# Patient Record
Sex: Male | Born: 1935 | Race: White | Hispanic: No | Marital: Married | Smoking: Never smoker
Health system: Southern US, Community
[De-identification: ages and names within clinical notes are randomized; demographics above are authoritative.]

## PROBLEM LIST (undated history)

## (undated) DIAGNOSIS — G14 Postpolio syndrome: Secondary | ICD-10-CM

## (undated) DIAGNOSIS — H409 Unspecified glaucoma: Secondary | ICD-10-CM

## (undated) DIAGNOSIS — Z8546 Personal history of malignant neoplasm of prostate: Secondary | ICD-10-CM

## (undated) DIAGNOSIS — E119 Type 2 diabetes mellitus without complications: Secondary | ICD-10-CM

## (undated) DIAGNOSIS — G629 Polyneuropathy, unspecified: Secondary | ICD-10-CM

## (undated) DIAGNOSIS — K219 Gastro-esophageal reflux disease without esophagitis: Secondary | ICD-10-CM

## (undated) DIAGNOSIS — M1990 Unspecified osteoarthritis, unspecified site: Secondary | ICD-10-CM

## (undated) HISTORY — DX: Personal history of malignant neoplasm of prostate: Z85.46

## (undated) HISTORY — DX: Unspecified osteoarthritis, unspecified site: M19.90

## (undated) HISTORY — PX: EYE SURGERY: SHX253

## (undated) HISTORY — DX: Unspecified glaucoma: H40.9

---

## 2002-07-26 ENCOUNTER — Ambulatory Visit (HOSPITAL_COMMUNITY): Admission: RE | Admit: 2002-07-26 | Discharge: 2002-07-26 | Payer: Self-pay | Admitting: *Deleted

## 2005-03-04 ENCOUNTER — Ambulatory Visit: Payer: Self-pay | Admitting: Psychology

## 2005-04-03 ENCOUNTER — Ambulatory Visit: Payer: Self-pay | Admitting: Psychology

## 2005-05-06 ENCOUNTER — Ambulatory Visit: Payer: Self-pay | Admitting: Psychology

## 2005-05-20 ENCOUNTER — Ambulatory Visit: Payer: Self-pay | Admitting: Psychology

## 2005-06-14 ENCOUNTER — Ambulatory Visit: Payer: Self-pay | Admitting: Psychology

## 2005-07-11 ENCOUNTER — Emergency Department (HOSPITAL_COMMUNITY): Admission: EM | Admit: 2005-07-11 | Discharge: 2005-07-11 | Payer: Self-pay | Admitting: Emergency Medicine

## 2005-07-17 ENCOUNTER — Ambulatory Visit: Payer: Self-pay | Admitting: Psychology

## 2005-07-23 ENCOUNTER — Ambulatory Visit: Payer: Self-pay | Admitting: Psychology

## 2005-07-29 ENCOUNTER — Ambulatory Visit: Payer: Self-pay | Admitting: Psychology

## 2005-08-21 ENCOUNTER — Ambulatory Visit: Payer: Self-pay | Admitting: Psychology

## 2005-09-06 ENCOUNTER — Ambulatory Visit: Payer: Self-pay | Admitting: Psychology

## 2005-09-30 HISTORY — PX: INSERTION PROSTATE RADIATION SEED: SUR718

## 2005-10-15 ENCOUNTER — Encounter: Admission: RE | Admit: 2005-10-15 | Discharge: 2005-10-15 | Payer: Self-pay | Admitting: Neurology

## 2005-10-22 ENCOUNTER — Ambulatory Visit: Payer: Self-pay | Admitting: Psychology

## 2005-11-04 ENCOUNTER — Ambulatory Visit: Payer: Self-pay | Admitting: Pulmonary Disease

## 2005-11-05 ENCOUNTER — Ambulatory Visit: Payer: Self-pay | Admitting: Psychology

## 2005-11-12 ENCOUNTER — Ambulatory Visit: Payer: Self-pay | Admitting: Psychology

## 2005-12-03 ENCOUNTER — Ambulatory Visit: Payer: Self-pay | Admitting: Psychology

## 2005-12-31 ENCOUNTER — Ambulatory Visit: Payer: Self-pay | Admitting: Psychology

## 2006-01-14 ENCOUNTER — Ambulatory Visit: Payer: Self-pay | Admitting: Psychology

## 2006-02-11 ENCOUNTER — Ambulatory Visit: Payer: Self-pay | Admitting: Psychology

## 2006-02-25 ENCOUNTER — Ambulatory Visit: Payer: Self-pay | Admitting: Psychology

## 2006-05-06 ENCOUNTER — Ambulatory Visit: Payer: Self-pay | Admitting: Psychology

## 2006-05-20 ENCOUNTER — Ambulatory Visit: Payer: Self-pay | Admitting: Psychology

## 2006-06-17 ENCOUNTER — Ambulatory Visit: Payer: Self-pay | Admitting: Psychology

## 2006-07-14 ENCOUNTER — Ambulatory Visit: Payer: Self-pay | Admitting: Psychology

## 2006-07-29 ENCOUNTER — Ambulatory Visit: Payer: Self-pay | Admitting: Psychology

## 2006-08-26 ENCOUNTER — Ambulatory Visit: Payer: Self-pay | Admitting: Psychology

## 2010-09-30 DIAGNOSIS — F0390 Unspecified dementia without behavioral disturbance: Secondary | ICD-10-CM

## 2010-09-30 HISTORY — DX: Unspecified dementia, unspecified severity, without behavioral disturbance, psychotic disturbance, mood disturbance, and anxiety: F03.90

## 2014-09-30 HISTORY — PX: MELANOMA EXCISION: SHX5266

## 2021-01-28 ENCOUNTER — Emergency Department (HOSPITAL_COMMUNITY): Payer: Medicare PPO

## 2021-01-28 ENCOUNTER — Emergency Department (HOSPITAL_COMMUNITY)
Admission: EM | Admit: 2021-01-28 | Discharge: 2021-01-28 | Disposition: A | Discharge status: Home / Self Care | Payer: Medicare PPO | Attending: Emergency Medicine | Admitting: Emergency Medicine

## 2021-01-28 ENCOUNTER — Encounter (HOSPITAL_COMMUNITY): Payer: Self-pay | Admitting: Pharmacy Technician

## 2021-01-28 ENCOUNTER — Other Ambulatory Visit: Payer: Self-pay

## 2021-01-28 DIAGNOSIS — E119 Type 2 diabetes mellitus without complications: Secondary | ICD-10-CM | POA: Diagnosis not present

## 2021-01-28 DIAGNOSIS — G44309 Post-traumatic headache, unspecified, not intractable: Secondary | ICD-10-CM | POA: Insufficient documentation

## 2021-01-28 DIAGNOSIS — F0390 Unspecified dementia without behavioral disturbance: Secondary | ICD-10-CM | POA: Diagnosis not present

## 2021-01-28 DIAGNOSIS — M545 Low back pain, unspecified: Secondary | ICD-10-CM | POA: Insufficient documentation

## 2021-01-28 DIAGNOSIS — R519 Headache, unspecified: Secondary | ICD-10-CM | POA: Diagnosis present

## 2021-01-28 DIAGNOSIS — Y92002 Bathroom of unspecified non-institutional (private) residence single-family (private) house as the place of occurrence of the external cause: Secondary | ICD-10-CM | POA: Diagnosis not present

## 2021-01-28 DIAGNOSIS — W182XXA Fall in (into) shower or empty bathtub, initial encounter: Secondary | ICD-10-CM | POA: Diagnosis not present

## 2021-01-28 DIAGNOSIS — W19XXXA Unspecified fall, initial encounter: Secondary | ICD-10-CM

## 2021-01-28 HISTORY — DX: Type 2 diabetes mellitus without complications: E11.9

## 2021-01-28 LAB — CBC WITH DIFFERENTIAL/PLATELET
Abs Immature Granulocytes: 0.02 10*3/uL (ref 0.00–0.07)
Basophils Absolute: 0 10*3/uL (ref 0.0–0.1)
Basophils Relative: 0 %
Eosinophils Absolute: 0.4 10*3/uL (ref 0.0–0.5)
Eosinophils Relative: 6 %
HCT: 38.3 % — ABNORMAL LOW (ref 39.0–52.0)
Hemoglobin: 12.4 g/dL — ABNORMAL LOW (ref 13.0–17.0)
Immature Granulocytes: 0 %
Lymphocytes Relative: 29 %
Lymphs Abs: 2 10*3/uL (ref 0.7–4.0)
MCH: 30.4 pg (ref 26.0–34.0)
MCHC: 32.4 g/dL (ref 30.0–36.0)
MCV: 93.9 fL (ref 80.0–100.0)
Monocytes Absolute: 0.6 10*3/uL (ref 0.1–1.0)
Monocytes Relative: 8 %
Neutro Abs: 3.9 10*3/uL (ref 1.7–7.7)
Neutrophils Relative %: 57 %
Platelets: 256 10*3/uL (ref 150–400)
RBC: 4.08 MIL/uL — ABNORMAL LOW (ref 4.22–5.81)
RDW: 13.8 % (ref 11.5–15.5)
WBC: 7 10*3/uL (ref 4.0–10.5)
nRBC: 0 % (ref 0.0–0.2)

## 2021-01-28 LAB — COMPREHENSIVE METABOLIC PANEL
ALT: 28 U/L (ref 0–44)
AST: 22 U/L (ref 15–41)
Albumin: 3.7 g/dL (ref 3.5–5.0)
Alkaline Phosphatase: 34 U/L — ABNORMAL LOW (ref 38–126)
Anion gap: 7 (ref 5–15)
BUN: 16 mg/dL (ref 8–23)
CO2: 25 mmol/L (ref 22–32)
Calcium: 8.9 mg/dL (ref 8.9–10.3)
Chloride: 105 mmol/L (ref 98–111)
Creatinine, Ser: 1.3 mg/dL — ABNORMAL HIGH (ref 0.61–1.24)
GFR, Estimated: 55 mL/min — ABNORMAL LOW (ref >60)
Glucose, Bld: 180 mg/dL — ABNORMAL HIGH (ref 70–99)
Potassium: 4.7 mmol/L (ref 3.5–5.1)
Sodium: 137 mmol/L (ref 135–145)
Total Bilirubin: 0.4 mg/dL (ref 0.3–1.2)
Total Protein: 6 g/dL — ABNORMAL LOW (ref 6.5–8.1)

## 2021-01-28 LAB — URINALYSIS, ROUTINE W REFLEX MICROSCOPIC
Bilirubin Urine: NEGATIVE
Glucose, UA: 50 mg/dL — AB
Hgb urine dipstick: NEGATIVE
Ketones, ur: NEGATIVE mg/dL
Leukocytes,Ua: NEGATIVE
Nitrite: NEGATIVE
Protein, ur: NEGATIVE mg/dL
Specific Gravity, Urine: 1.015 (ref 1.005–1.030)
pH: 5 (ref 5.0–8.0)

## 2021-01-28 MED ORDER — ACETAMINOPHEN 500 MG PO TABS
1000.0000 mg | ORAL_TABLET | Freq: Once | ORAL | Status: AC
  Administered 2021-01-28: 1000 mg via ORAL
  Filled 2021-01-28: qty 2

## 2021-01-28 NOTE — ED Notes (Signed)
Patient transported to CT 

## 2021-01-28 NOTE — ED Triage Notes (Signed)
Pt here via pov with reports of multiple falls since last night. Pt states he feels "strange" and then falls. Pt denies injury from falls.

## 2021-01-28 NOTE — ED Notes (Signed)
Pt ambulated from room to bathroom using walker with 1RN and pt's wife assist. Pt denied feeling dizzy or light headed.

## 2021-01-28 NOTE — Discharge Instructions (Signed)
Your imaging today was overall reassuring.  No signs of head bleed, intracranial trauma, fracture, dislocation. Your blood work was reassuring, did not indicate any acute abnormality. Follow-up with your primary care doctor for recheck. Return to emergency room with any new, worsening, concerning symptoms.

## 2021-01-28 NOTE — ED Provider Notes (Signed)
[facility] EMERGENCY DEPARTMENT Provider Note   CSN: [id number] Arrival date & time: 01/28/21  0848     History Chief Complaint  Patient presents with  . Fall    Jeffery Stewart is a 85 y.o. male presenting for evaluation after fall.  Level 5 caveat due to dementia.  History provided mostly by patient's wife.  States he fell yesterday, hitting the back of his head against the bathtub.  He did not lose consciousness, but reported seeing "stars."  Patient had no change in alteration and no headache immediately after the fall.  He does have a mild headache today, prompting his ER visit.  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  He is not on blood thinners.  He has been mentating at his baseline today, and ambulatory to his baseline.  He did have another fall this morning, which she describes as him sliding off the chair, which is not abnormal for him.  She denies recent fever, cough, reported chest pain, nausea, vomiting, report of abdominal pain, change in urination or bowel output.  No recent change in medications.  HPI     Past Medical History:  Diagnosis Date  . Diabetes mellitus without complication (HCC)     There are no problems to display for this patient.   History reviewed. No pertinent surgical history.     No family history on file.     Home Medications Prior to Admission medications   Not on File    Allergies    Prednisone  Review of Systems   Review of Systems  Unable to perform ROS: Dementia  Neurological: Positive for headaches.    Physical Exam Updated Vital Signs BP (!) 163/74   Pulse (!) 47   Temp 97.6 F (36.4 C) (Oral)   Resp 17   SpO2 100%   Physical Exam Vitals and nursing note reviewed.  Constitutional:      General: He is not in acute distress.    Appearance: He is well-developed.     Comments: Resting in the bed in NAD  HENT:     Head: Normocephalic and  atraumatic.     Comments: No obvious signs of head trauma Eyes:     Extraocular Movements: Extraocular movements intact.     Conjunctiva/sclera: Conjunctivae normal.     Pupils: Pupils are equal, round, and reactive to light.  Neck:     Comments: Mild diffuse ttp of the neck, baseline per wife Cardiovascular:     Rate and Rhythm: Normal rate and regular rhythm.     Pulses: Normal pulses.  Pulmonary:     Effort: Pulmonary effort is normal. No respiratory distress.     Breath sounds: Normal breath sounds. No wheezing.  Abdominal:     General: There is no distension.     Palpations: Abdomen is soft. There is no mass.     Tenderness: There is no abdominal tenderness. There is no guarding or rebound.  Musculoskeletal:        General: Normal range of motion.     Cervical back: Normal range of motion and neck supple.     Comments: Strength intact x4.  No tenderness palpation of back or midline spine.  No step-offs or deformities.  Able to go from laying to sitting without difficulty  Skin:    General: Skin is warm and dry.     Capillary Refill: Capillary refill takes less than 2  seconds.  Neurological:     Mental Status: He is alert.     Comments: Oriented to person, baseline per wife     ED Results / Procedures / Treatments   Labs (all labs ordered are listed, but only abnormal results are displayed) Labs Reviewed  CBC WITH DIFFERENTIAL/PLATELET - Abnormal; Notable for the following components:      Result Value   RBC 4.08 (*)    Hemoglobin 12.4 (*)    HCT 38.3 (*)    All other components within normal limits  COMPREHENSIVE METABOLIC PANEL - Abnormal; Notable for the following components:   Glucose, Bld 180 (*)    Creatinine, Ser 1.30 (*)    Total Protein 6.0 (*)    Alkaline Phosphatase 34 (*)    GFR, Estimated 55 (*)    All other components within normal limits  URINALYSIS, ROUTINE W REFLEX MICROSCOPIC - Abnormal; Notable for the following components:   Glucose, UA 50 (*)     All other components within normal limits    EKG EKG Interpretation  Date/Time:  Sunday Jan 28 2021 08:59:15 EDT Ventricular Rate:  47 PR Interval:  121 QRS Duration: 93 QT Interval:  451 QTC Calculation: 399 R Axis:   -19 Text Interpretation: Sinus or ectopic atrial bradycardia Borderline left axis deviation Low voltage, precordial leads Abnormal R-wave progression, early transition Confirmed by Kristine Royal [id number]) on 01/28/2021 9:04:18 AM   Radiology DG Lumbar Spine Complete  Result Date: 01/28/2021 CLINICAL DATA:  Pain.  Fall. EXAM: LUMBAR SPINE - COMPLETE 4+ VIEW COMPARISON:  None. FINDINGS: Brachytherapy seeds in the region of the prostate. Scoliotic curvature lumbar spine, apex to the left. No other malalignment. No fractures. Minimal degenerative disc disease with tiny anterior osteophytes. Lower lumbar facet degenerative changes. Mild calcified atherosclerosis in the abdominal aorta. IMPRESSION: 1. No fracture or traumatic malalignment. 2. Degenerative changes, particularly in the lower lumbar facets. 3. Mild calcified atherosclerosis in the abdominal aorta. Electronically Signed   By: Gerome Sam III M.D   On: 01/28/2021 12:34   DG Pelvis 1-2 Views  Result Date: 01/28/2021 CLINICAL DATA:  Pain after fall EXAM: PELVIS - 1-2 VIEW COMPARISON:  None. FINDINGS: There is no evidence of pelvic fracture or diastasis. No pelvic bone lesions are seen. IMPRESSION: Negative. Electronically Signed   By: Gerome Sam III M.D   On: 01/28/2021 12:35   CT Head Wo Contrast  Result Date: 01/28/2021 CLINICAL DATA:  Multiple falls.  Pain. EXAM: CT HEAD WITHOUT CONTRAST CT CERVICAL SPINE WITHOUT CONTRAST TECHNIQUE: Multidetector CT imaging of the head and cervical spine was performed following the standard protocol without intravenous contrast. Multiplanar CT image reconstructions of the cervical spine were also generated. COMPARISON:  None. FINDINGS: CT HEAD FINDINGS Brain: No evidence of  acute infarction, hemorrhage, hydrocephalus, extra-axial collection or mass lesion/mass effect. Vascular: Calcified atherosclerosis is seen in the intracranial carotids. Skull: Normal. Negative for fracture or focal lesion. Sinuses/Orbits: No acute finding. Other: None. CT CERVICAL SPINE FINDINGS Alignment: Minimal anterolisthesis of C5 versus C6 was also seen on an x-ray from 2006, nonacute. There is mild focal reversal of normal lordosis in this region. No other malalignment. Skull base and vertebrae: No acute fracture. No primary bone lesion or focal pathologic process. Soft tissues and spinal canal: No prevertebral fluid or swelling. No visible canal hematoma. Disc levels: Degenerative disc disease most marked at C5-6, C6-7, and C7-T1. Facet degenerative changes. Upper chest: Negative. Other: No other abnormalities. IMPRESSION: 1. No acute intracranial  abnormalities. 2. No fracture or traumatic malalignment in the cervical spine. Degenerative changes. Electronically Signed   By: Gerome Sam III M.D   On: 01/28/2021 12:33   CT Cervical Spine Wo Contrast  Result Date: 01/28/2021 CLINICAL DATA:  Multiple falls.  Pain. EXAM: CT HEAD WITHOUT CONTRAST CT CERVICAL SPINE WITHOUT CONTRAST TECHNIQUE: Multidetector CT imaging of the head and cervical spine was performed following the standard protocol without intravenous contrast. Multiplanar CT image reconstructions of the cervical spine were also generated. COMPARISON:  None. FINDINGS: CT HEAD FINDINGS Brain: No evidence of acute infarction, hemorrhage, hydrocephalus, extra-axial collection or mass lesion/mass effect. Vascular: Calcified atherosclerosis is seen in the intracranial carotids. Skull: Normal. Negative for fracture or focal lesion. Sinuses/Orbits: No acute finding. Other: None. CT CERVICAL SPINE FINDINGS Alignment: Minimal anterolisthesis of C5 versus C6 was also seen on an x-ray from 2006, nonacute. There is mild focal reversal of normal lordosis in  this region. No other malalignment. Skull base and vertebrae: No acute fracture. No primary bone lesion or focal pathologic process. Soft tissues and spinal canal: No prevertebral fluid or swelling. No visible canal hematoma. Disc levels: Degenerative disc disease most marked at C5-6, C6-7, and C7-T1. Facet degenerative changes. Upper chest: Negative. Other: No other abnormalities. IMPRESSION: 1. No acute intracranial abnormalities. 2. No fracture or traumatic malalignment in the cervical spine. Degenerative changes. Electronically Signed   By: Gerome Sam III M.D   On: 01/28/2021 12:33    Procedures Procedures   Medications Ordered in ED Medications  acetaminophen (TYLENOL) tablet 1,000 mg (1,000 mg Oral Given 01/28/21 1046)    ED Course  I have reviewed the triage vital signs and the nursing notes.  Pertinent labs & imaging results that were available during my care of the patient were reviewed by me and considered in my medical decision making (see chart for details).    MDM Rules/Calculators/A&P                          Patient presented for evaluation of fall yesterday and again this morning.  On exam, patient appears nontoxic.  No obvious signs of head trauma.  Per wife, he is at baseline mental status.  He has no obvious neurologic deficits outside of his normal on my exam.  As patient cannot state what caused him to fall, will obtain basic labs, urine, EKG to ensure no obvious infection, metabolic abnormality, anemia, arrhythmia.  Will obtain CT imaging of head and neck due to trauma.  On reassessment, patient now is reporting pain in his low back.  He had no pain initially on my exam, however due to his dementia, his exam may be unreliable.  We will add on x-rays of the low back and pelvis.  CT head and neck negative for acute findings.  X-ray of pelvis and low back viewed and independently interpreted by me, no fracture or dislocation.  Labs overall reassuring.  No significant  anemia.  Minimal nonspecific leukocytosis.  Without fever or infectious symptoms, doubt infection.  Electrolytes are stable.  Urine negative for infection.  Discussed findings with patient and wife.  Patient able to ambulate.  At this time, patient appears safe for discharge.  Return precautions given.  Patient states he understands and agrees to plan.  Final Clinical Impression(s) / ED Diagnoses Final diagnoses:  Fall, initial encounter  Acute low back pain without sciatica, unspecified back pain laterality  Post-traumatic headache, not intractable, unspecified chronicity pattern  Rx / DC Orders ED Discharge Orders    None       Alveria ApleyCaccavale, Kahli Fitzgerald, PA-C 01/28/21 1518    Wynetta FinesMessick, Peter C, MD 01/29/21 [phone]

## 2021-12-12 DIAGNOSIS — Z808 Family history of malignant neoplasm of other organs or systems: Secondary | ICD-10-CM | POA: Diagnosis not present

## 2021-12-12 DIAGNOSIS — L570 Actinic keratosis: Secondary | ICD-10-CM | POA: Diagnosis not present

## 2021-12-12 DIAGNOSIS — D225 Melanocytic nevi of trunk: Secondary | ICD-10-CM | POA: Diagnosis not present

## 2021-12-12 DIAGNOSIS — D485 Neoplasm of uncertain behavior of skin: Secondary | ICD-10-CM | POA: Diagnosis not present

## 2022-01-02 DIAGNOSIS — E7800 Pure hypercholesterolemia, unspecified: Secondary | ICD-10-CM | POA: Diagnosis not present

## 2022-01-02 DIAGNOSIS — N1831 Chronic kidney disease, stage 3a: Secondary | ICD-10-CM | POA: Diagnosis not present

## 2022-01-02 DIAGNOSIS — E1142 Type 2 diabetes mellitus with diabetic polyneuropathy: Secondary | ICD-10-CM | POA: Diagnosis not present

## 2022-01-02 DIAGNOSIS — E1122 Type 2 diabetes mellitus with diabetic chronic kidney disease: Secondary | ICD-10-CM | POA: Diagnosis not present

## 2022-01-09 DIAGNOSIS — Z0000 Encounter for general adult medical examination without abnormal findings: Secondary | ICD-10-CM | POA: Diagnosis not present

## 2022-01-09 DIAGNOSIS — F3340 Major depressive disorder, recurrent, in remission, unspecified: Secondary | ICD-10-CM | POA: Diagnosis not present

## 2022-01-09 DIAGNOSIS — E1122 Type 2 diabetes mellitus with diabetic chronic kidney disease: Secondary | ICD-10-CM | POA: Diagnosis not present

## 2022-01-09 DIAGNOSIS — D649 Anemia, unspecified: Secondary | ICD-10-CM | POA: Diagnosis not present

## 2022-01-09 DIAGNOSIS — G2581 Restless legs syndrome: Secondary | ICD-10-CM | POA: Diagnosis not present

## 2022-01-09 DIAGNOSIS — G4761 Periodic limb movement disorder: Secondary | ICD-10-CM | POA: Diagnosis not present

## 2022-01-09 DIAGNOSIS — Z8546 Personal history of malignant neoplasm of prostate: Secondary | ICD-10-CM | POA: Diagnosis not present

## 2022-01-09 DIAGNOSIS — E1142 Type 2 diabetes mellitus with diabetic polyneuropathy: Secondary | ICD-10-CM | POA: Diagnosis not present

## 2022-01-09 DIAGNOSIS — G309 Alzheimer's disease, unspecified: Secondary | ICD-10-CM | POA: Diagnosis not present

## 2022-02-07 DIAGNOSIS — G4700 Insomnia, unspecified: Secondary | ICD-10-CM | POA: Diagnosis not present

## 2022-02-07 DIAGNOSIS — I10 Essential (primary) hypertension: Secondary | ICD-10-CM | POA: Diagnosis not present

## 2022-02-07 DIAGNOSIS — K219 Gastro-esophageal reflux disease without esophagitis: Secondary | ICD-10-CM | POA: Diagnosis not present

## 2022-02-07 DIAGNOSIS — E785 Hyperlipidemia, unspecified: Secondary | ICD-10-CM | POA: Diagnosis not present

## 2022-02-07 DIAGNOSIS — F02A0 Dementia in other diseases classified elsewhere, mild, without behavioral disturbance, psychotic disturbance, mood disturbance, and anxiety: Secondary | ICD-10-CM | POA: Diagnosis not present

## 2022-02-07 DIAGNOSIS — E1142 Type 2 diabetes mellitus with diabetic polyneuropathy: Secondary | ICD-10-CM | POA: Diagnosis not present

## 2022-02-07 DIAGNOSIS — F3342 Major depressive disorder, recurrent, in full remission: Secondary | ICD-10-CM | POA: Diagnosis not present

## 2022-02-07 DIAGNOSIS — G309 Alzheimer's disease, unspecified: Secondary | ICD-10-CM | POA: Diagnosis not present

## 2022-02-07 DIAGNOSIS — H409 Unspecified glaucoma: Secondary | ICD-10-CM | POA: Diagnosis not present

## 2022-03-06 DIAGNOSIS — B91 Sequelae of poliomyelitis: Secondary | ICD-10-CM | POA: Diagnosis not present

## 2022-03-06 DIAGNOSIS — G301 Alzheimer's disease with late onset: Secondary | ICD-10-CM | POA: Diagnosis not present

## 2022-03-06 DIAGNOSIS — M549 Dorsalgia, unspecified: Secondary | ICD-10-CM | POA: Diagnosis not present

## 2022-03-06 DIAGNOSIS — F02C18 Dementia in other diseases classified elsewhere, severe, with other behavioral disturbance: Secondary | ICD-10-CM | POA: Diagnosis not present

## 2022-03-06 DIAGNOSIS — G8929 Other chronic pain: Secondary | ICD-10-CM | POA: Diagnosis not present

## 2022-03-06 DIAGNOSIS — F0280 Dementia in other diseases classified elsewhere without behavioral disturbance: Secondary | ICD-10-CM | POA: Diagnosis not present

## 2022-03-06 DIAGNOSIS — G309 Alzheimer's disease, unspecified: Secondary | ICD-10-CM | POA: Diagnosis not present

## 2022-03-06 DIAGNOSIS — M6281 Muscle weakness (generalized): Secondary | ICD-10-CM | POA: Diagnosis not present

## 2022-04-15 DIAGNOSIS — R051 Acute cough: Secondary | ICD-10-CM | POA: Diagnosis not present

## 2022-05-08 DIAGNOSIS — K219 Gastro-esophageal reflux disease without esophagitis: Secondary | ICD-10-CM | POA: Diagnosis not present

## 2022-05-08 DIAGNOSIS — E1142 Type 2 diabetes mellitus with diabetic polyneuropathy: Secondary | ICD-10-CM | POA: Diagnosis not present

## 2022-05-08 DIAGNOSIS — D649 Anemia, unspecified: Secondary | ICD-10-CM | POA: Diagnosis not present

## 2022-05-08 DIAGNOSIS — E1122 Type 2 diabetes mellitus with diabetic chronic kidney disease: Secondary | ICD-10-CM | POA: Diagnosis not present

## 2022-05-16 DIAGNOSIS — H401131 Primary open-angle glaucoma, bilateral, mild stage: Secondary | ICD-10-CM | POA: Diagnosis not present

## 2022-05-16 DIAGNOSIS — Z961 Presence of intraocular lens: Secondary | ICD-10-CM | POA: Diagnosis not present

## 2022-05-16 DIAGNOSIS — E119 Type 2 diabetes mellitus without complications: Secondary | ICD-10-CM | POA: Diagnosis not present

## 2022-05-16 DIAGNOSIS — H04123 Dry eye syndrome of bilateral lacrimal glands: Secondary | ICD-10-CM | POA: Diagnosis not present

## 2022-05-20 DIAGNOSIS — K120 Recurrent oral aphthae: Secondary | ICD-10-CM | POA: Diagnosis not present

## 2022-05-27 DIAGNOSIS — N1831 Chronic kidney disease, stage 3a: Secondary | ICD-10-CM | POA: Diagnosis not present

## 2022-05-27 DIAGNOSIS — E1142 Type 2 diabetes mellitus with diabetic polyneuropathy: Secondary | ICD-10-CM | POA: Diagnosis not present

## 2022-05-27 DIAGNOSIS — G14 Postpolio syndrome: Secondary | ICD-10-CM | POA: Diagnosis not present

## 2022-05-27 DIAGNOSIS — G2581 Restless legs syndrome: Secondary | ICD-10-CM | POA: Diagnosis not present

## 2022-05-27 DIAGNOSIS — K219 Gastro-esophageal reflux disease without esophagitis: Secondary | ICD-10-CM | POA: Diagnosis not present

## 2022-05-27 DIAGNOSIS — E1122 Type 2 diabetes mellitus with diabetic chronic kidney disease: Secondary | ICD-10-CM | POA: Diagnosis not present

## 2022-05-27 DIAGNOSIS — G309 Alzheimer's disease, unspecified: Secondary | ICD-10-CM | POA: Diagnosis not present

## 2022-05-27 DIAGNOSIS — G4761 Periodic limb movement disorder: Secondary | ICD-10-CM | POA: Diagnosis not present

## 2022-05-27 DIAGNOSIS — D649 Anemia, unspecified: Secondary | ICD-10-CM | POA: Diagnosis not present

## 2022-06-23 IMAGING — CT CT CERVICAL SPINE W/O CM
3 of 4 series · 12 of 33 positions shown, 14 images · non-contrast
Comparison: None.

CLINICAL DATA: Multiple falls.  Pain.

EXAM:
CT HEAD WITHOUT CONTRAST
CT CERVICAL SPINE WITHOUT CONTRAST
TECHNIQUE: Multidetector CT imaging of the head and cervical spine was
performed following the standard protocol without intravenous
contrast. Multiplanar CT image reconstructions of the cervical spine
were also generated.

[Series 5: c_spine 2.0 st · axial · 0.39mm/px · z∈[+1028,+1160]mm · 4 of 100 slices shown, 5 images]
[im 17/100  soft-tissue]
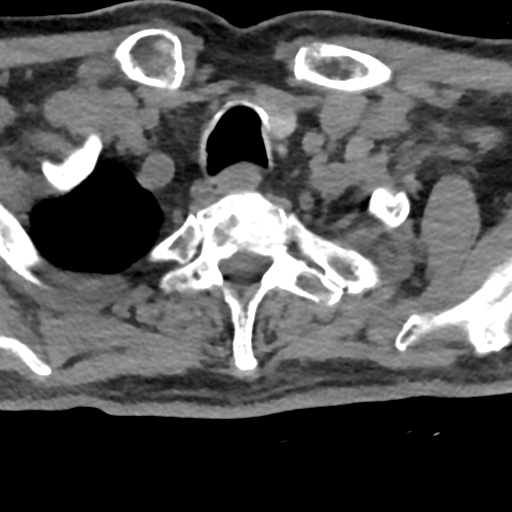
[im 17/100  bone]
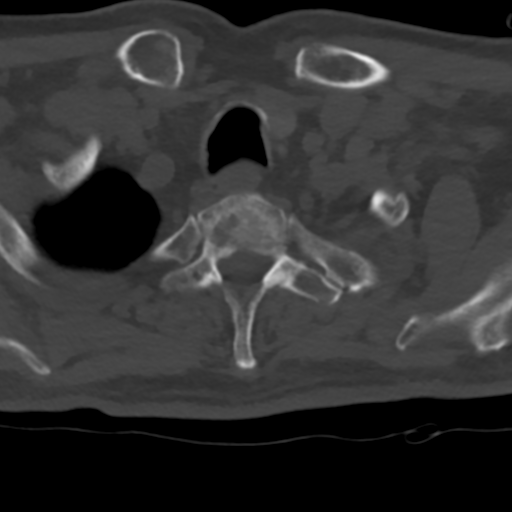
[im 34/100  bone]
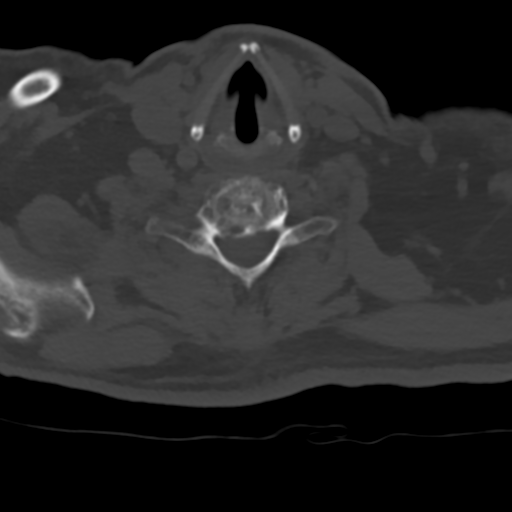
[im 67/100  bone]
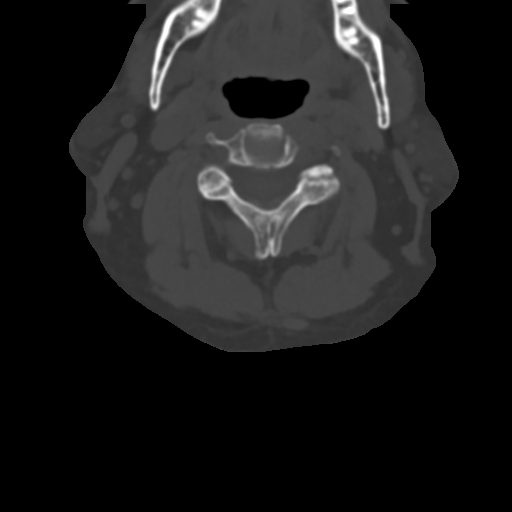
[im 83/100  bone]
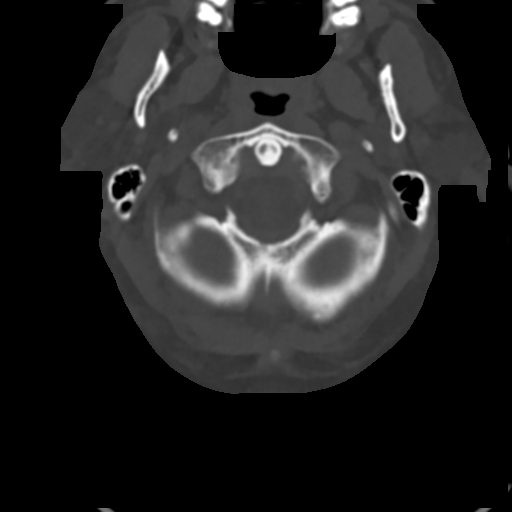

[Series 6: coronal bone · coronal · 0.31mm/px · 3 of 61 slices shown]
[im 13/61  bone]
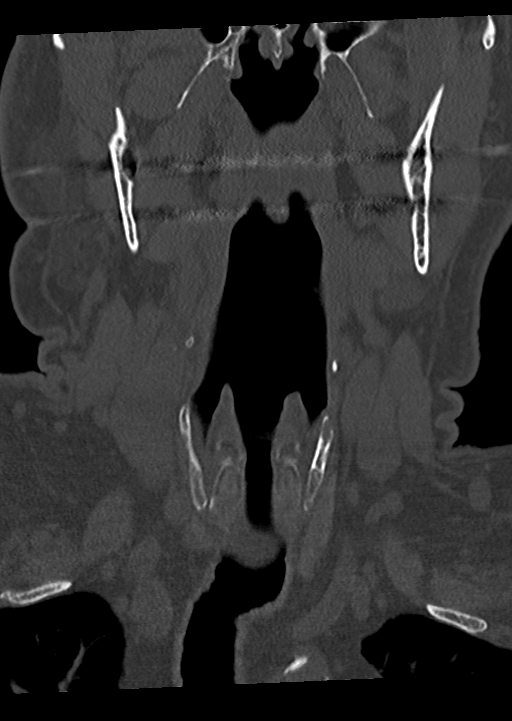
[im 25/61  bone]
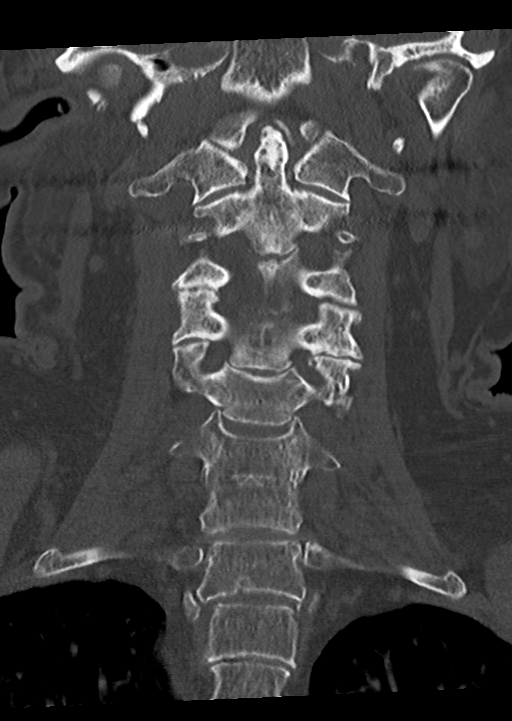
[im 37/61  bone]
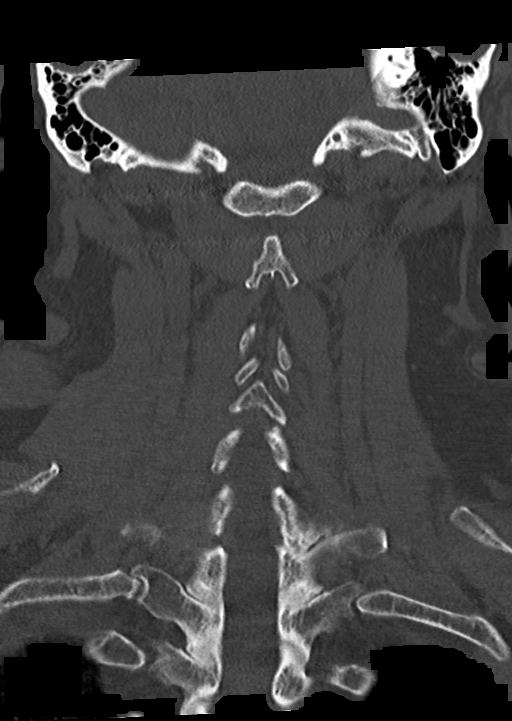

[Series 7: sagittal bone · sagittal · 0.30mm/px · 5 of 68 slices shown, 6 images]
[im 23/68  bone]
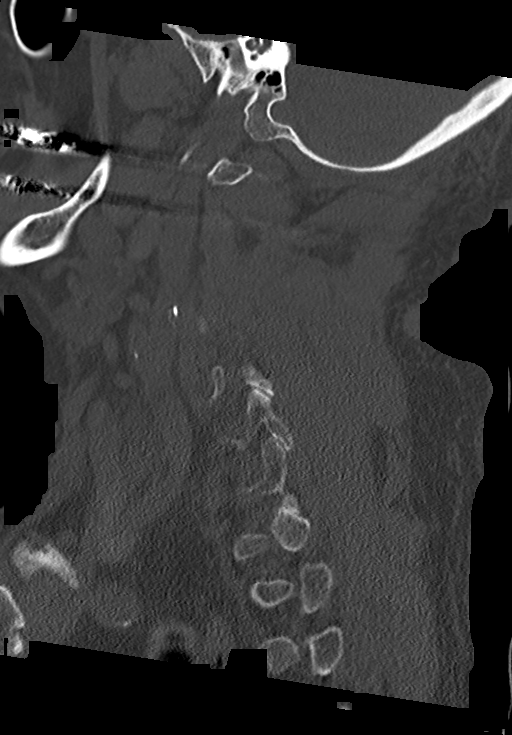
[im 28/68  bone]
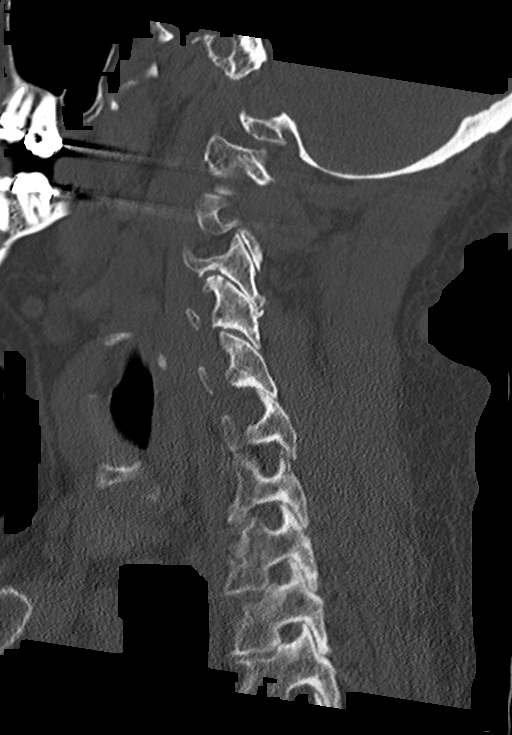
[im 34/68  soft-tissue]
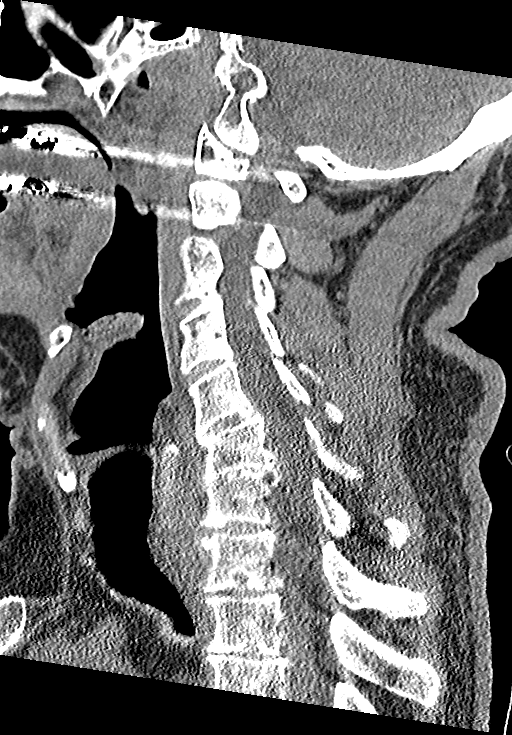
[im 34/68  bone]
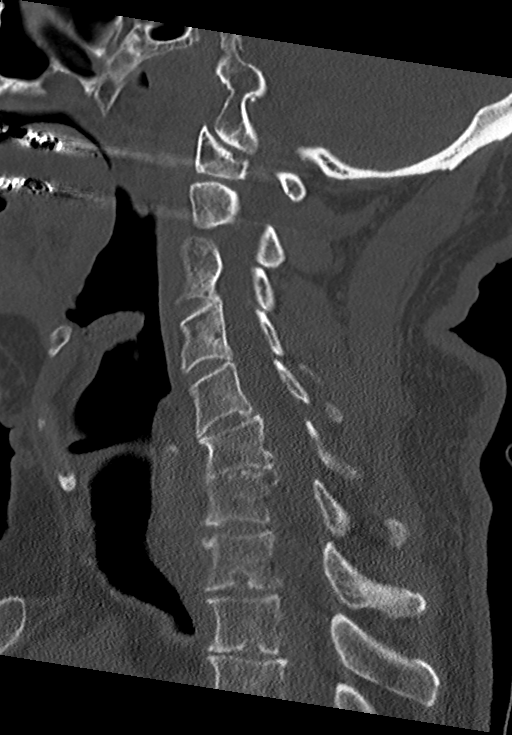
[im 40/68  bone]
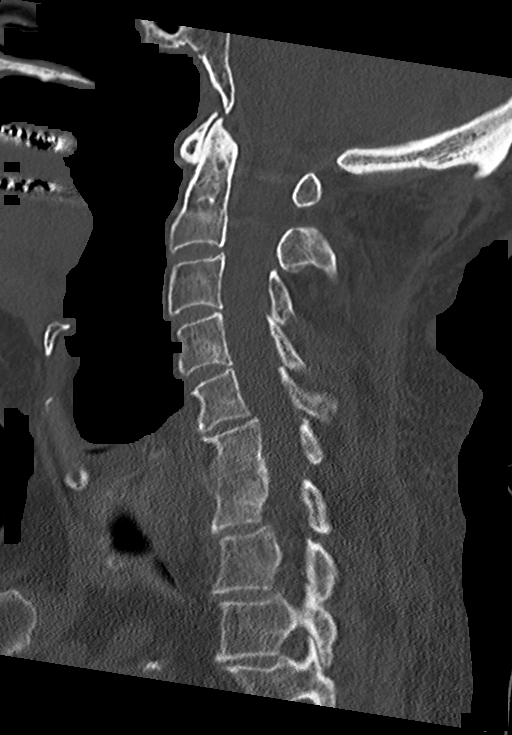
[im 45/68  bone]
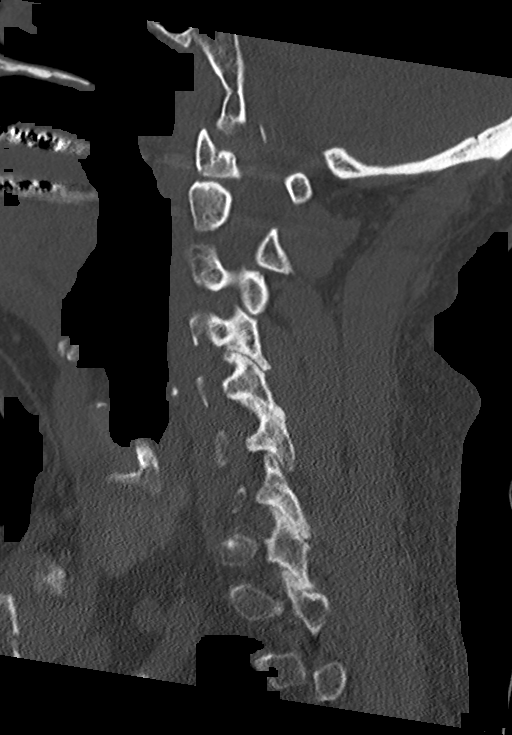

[12 of 33 positions shown; findings below may reference images not displayed]

FINDINGS: CT HEAD FINDINGS

Brain: No evidence of acute infarction, hemorrhage, hydrocephalus,
extra-axial collection or mass lesion/mass effect.

Vascular: Calcified atherosclerosis is seen in the intracranial
carotids.

Skull: Normal. Negative for fracture or focal lesion.

Sinuses/Orbits: No acute finding.

Other: None.

CT CERVICAL SPINE FINDINGS

Alignment: Minimal anterolisthesis of C5 versus C6 was also seen on
an x-ray from 6000, nonacute. There is mild focal reversal of normal
lordosis in this region. No other malalignment.

Skull base and vertebrae: No acute fracture. No primary bone lesion
or focal pathologic process.

Soft tissues and spinal canal: No prevertebral fluid or swelling. No
visible canal hematoma.

Disc levels: Degenerative disc disease most marked at C5-6, C6-7,
and C7-T1. Facet degenerative changes.

Upper chest: Negative.

Other: No other abnormalities.
IMPRESSION: 1. No acute intracranial abnormalities.
2. No fracture or traumatic malalignment in the cervical spine.
Degenerative changes.

## 2022-06-23 IMAGING — CR DG LUMBAR SPINE COMPLETE 4+V
4 series · 4 of 4 positions shown · non-contrast
Comparison: None.

CLINICAL DATA: Pain.  Fall.

EXAM:
LUMBAR SPINE - COMPLETE 4+ VIEW

[l-spine obl (1 of 2)]
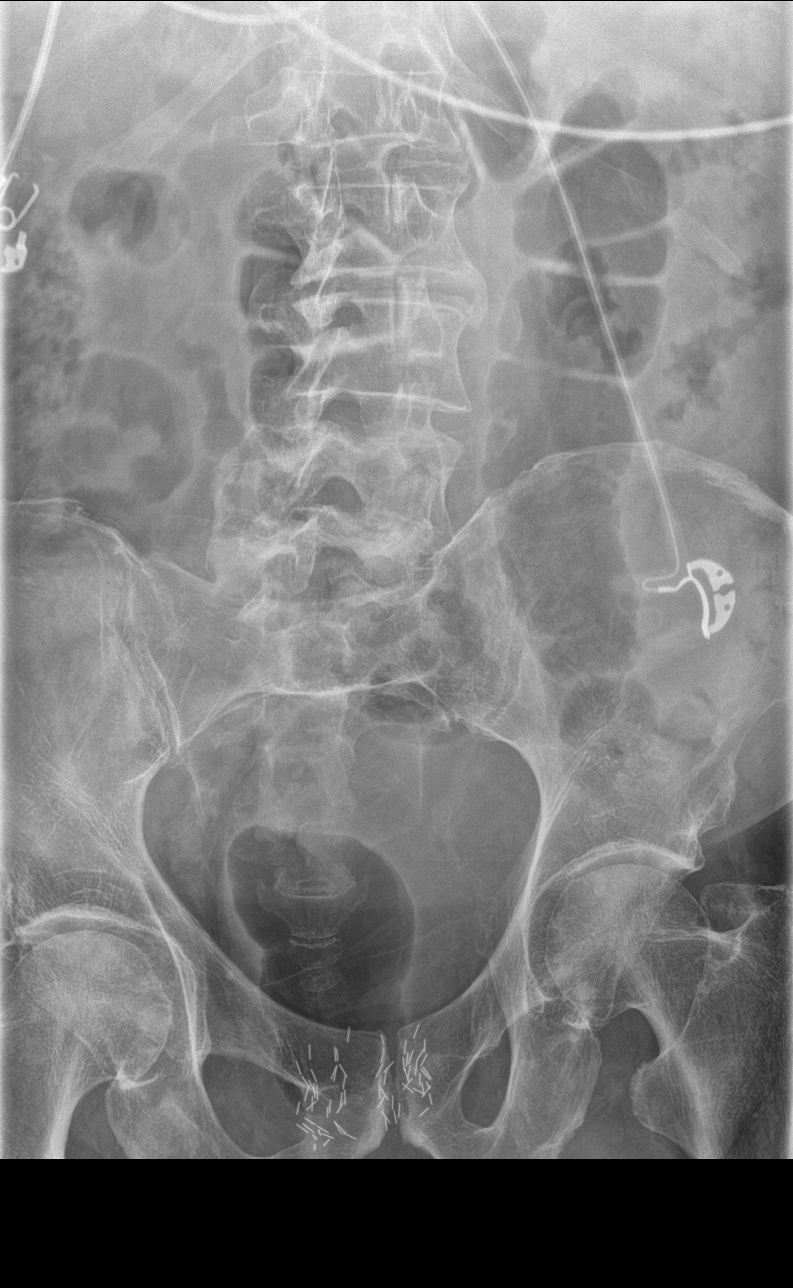

[l-spine obl (2 of 2)]
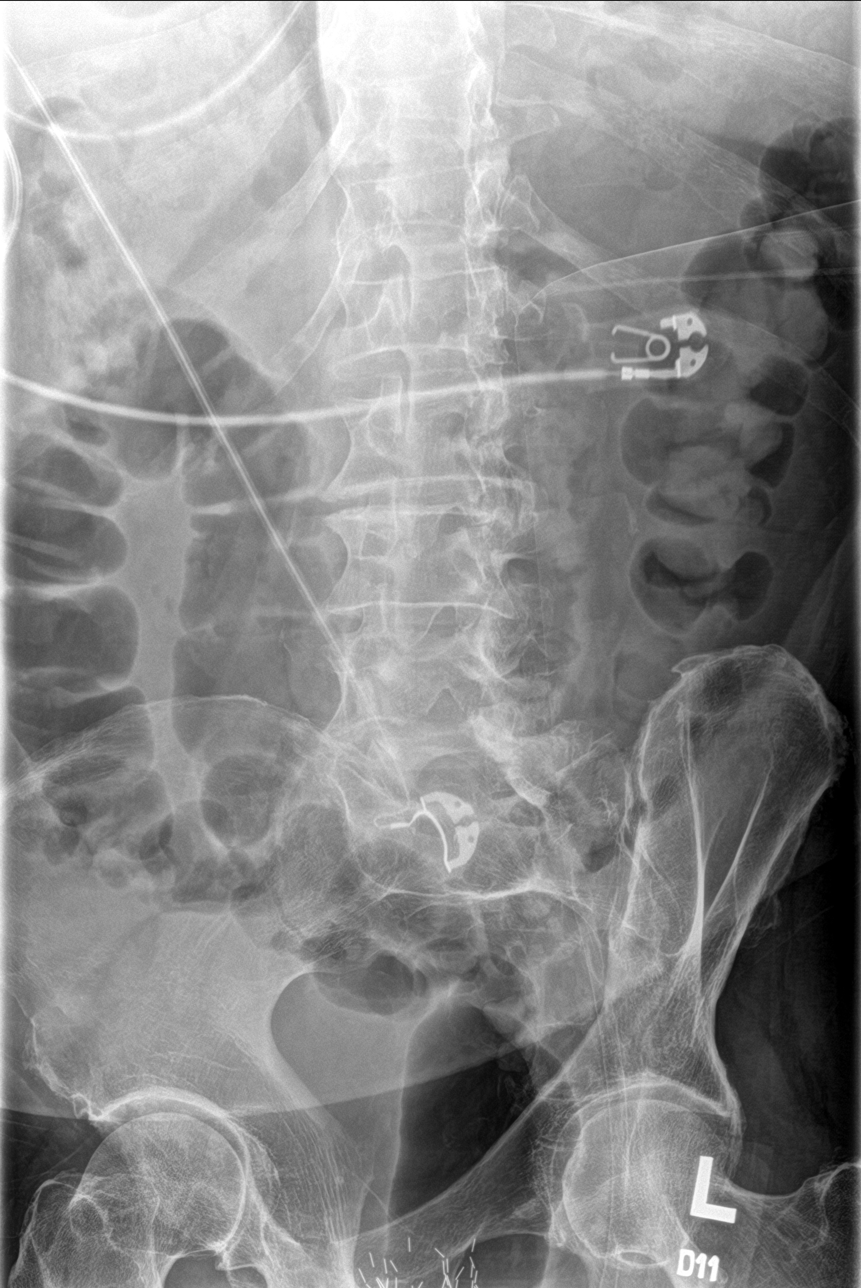

[l-spine lat]
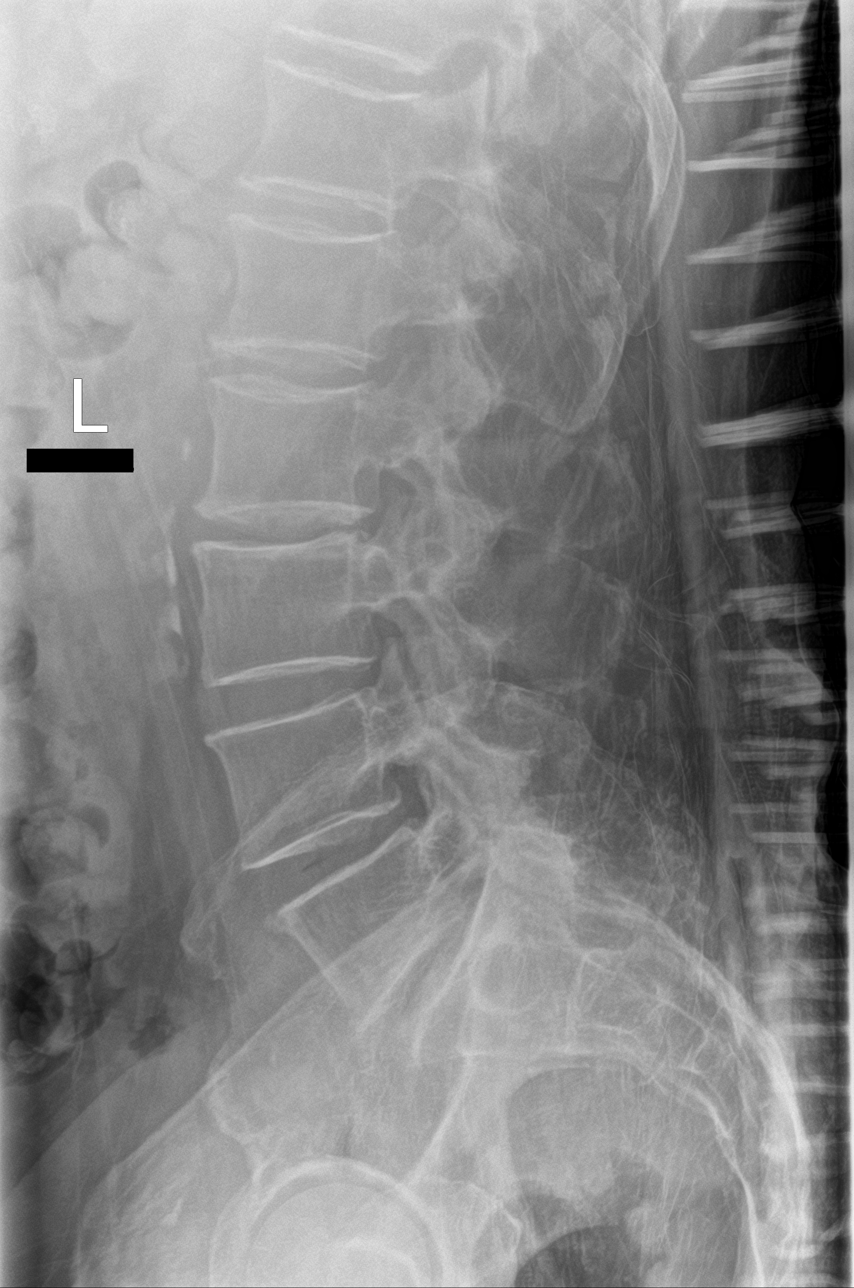

[l-spine ap]
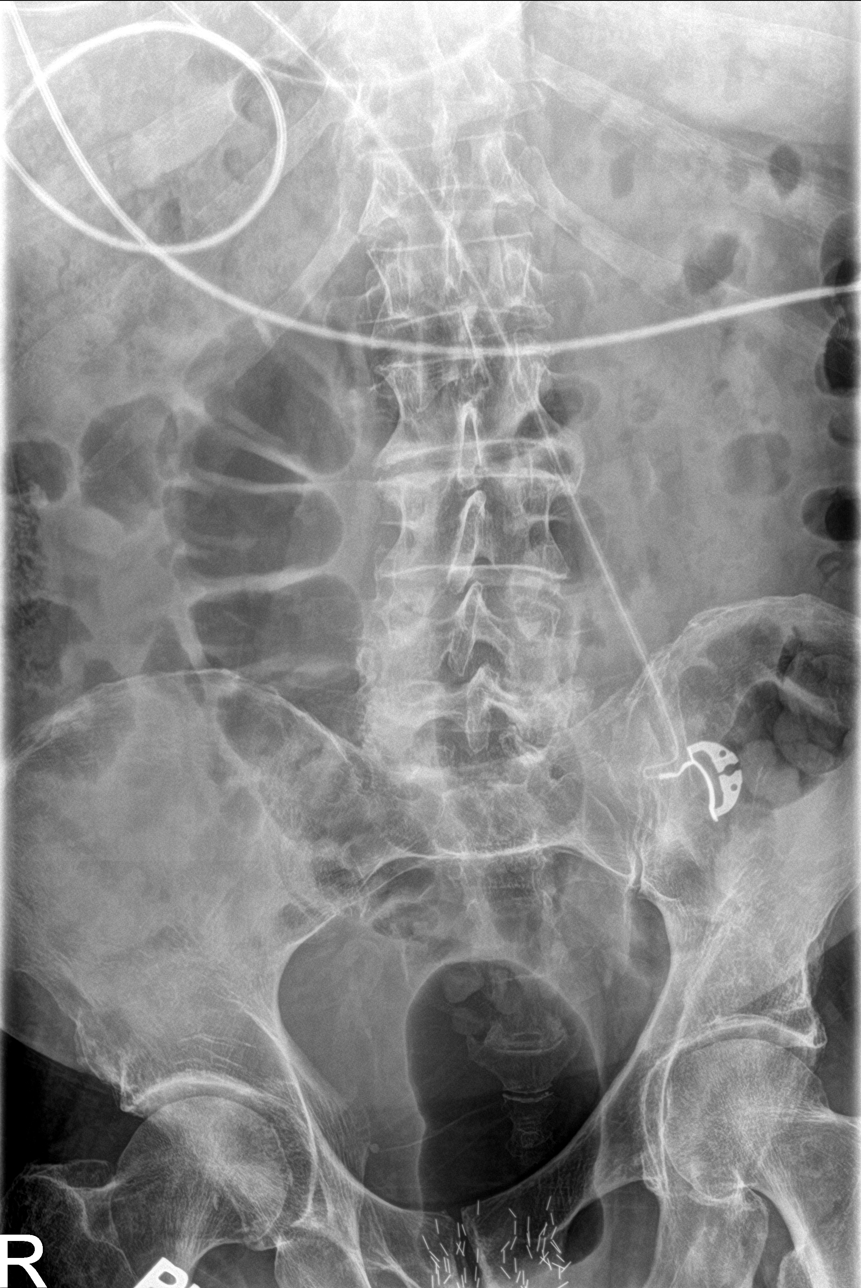

[4 of 4 positions shown; findings below may reference images not displayed]

FINDINGS: Brachytherapy seeds in the region of the prostate.

Scoliotic curvature lumbar spine, apex to the left. No other
malalignment. No fractures. Minimal degenerative disc disease with
tiny anterior osteophytes. Lower lumbar facet degenerative changes.
Mild calcified atherosclerosis in the abdominal aorta.
IMPRESSION: 1. No fracture or traumatic malalignment.
2. Degenerative changes, particularly in the lower lumbar facets.
3. Mild calcified atherosclerosis in the abdominal aorta.

## 2022-09-24 DIAGNOSIS — E119 Type 2 diabetes mellitus without complications: Secondary | ICD-10-CM | POA: Diagnosis not present

## 2022-09-24 DIAGNOSIS — E785 Hyperlipidemia, unspecified: Secondary | ICD-10-CM | POA: Diagnosis not present

## 2022-09-24 DIAGNOSIS — S91332A Puncture wound without foreign body, left foot, initial encounter: Secondary | ICD-10-CM | POA: Diagnosis not present

## 2022-09-24 DIAGNOSIS — G4730 Sleep apnea, unspecified: Secondary | ICD-10-CM | POA: Diagnosis not present

## 2022-09-24 DIAGNOSIS — M79672 Pain in left foot: Secondary | ICD-10-CM | POA: Diagnosis not present

## 2022-09-24 DIAGNOSIS — G309 Alzheimer's disease, unspecified: Secondary | ICD-10-CM | POA: Diagnosis not present

## 2022-09-24 DIAGNOSIS — K219 Gastro-esophageal reflux disease without esophagitis: Secondary | ICD-10-CM | POA: Diagnosis not present

## 2022-09-24 DIAGNOSIS — F0283 Dementia in other diseases classified elsewhere, unspecified severity, with mood disturbance: Secondary | ICD-10-CM | POA: Diagnosis not present

## 2022-09-24 DIAGNOSIS — M1990 Unspecified osteoarthritis, unspecified site: Secondary | ICD-10-CM | POA: Diagnosis not present

## 2022-09-24 DIAGNOSIS — F32A Depression, unspecified: Secondary | ICD-10-CM | POA: Diagnosis not present

## 2023-01-14 DIAGNOSIS — M9903 Segmental and somatic dysfunction of lumbar region: Secondary | ICD-10-CM | POA: Diagnosis not present

## 2023-01-14 DIAGNOSIS — M9901 Segmental and somatic dysfunction of cervical region: Secondary | ICD-10-CM | POA: Diagnosis not present

## 2023-01-14 DIAGNOSIS — M9902 Segmental and somatic dysfunction of thoracic region: Secondary | ICD-10-CM | POA: Diagnosis not present

## 2023-01-14 DIAGNOSIS — M50122 Cervical disc disorder at C5-C6 level with radiculopathy: Secondary | ICD-10-CM | POA: Diagnosis not present

## 2023-01-30 DIAGNOSIS — R634 Abnormal weight loss: Secondary | ICD-10-CM | POA: Diagnosis not present

## 2023-01-30 DIAGNOSIS — K219 Gastro-esophageal reflux disease without esophagitis: Secondary | ICD-10-CM | POA: Diagnosis not present

## 2023-01-30 DIAGNOSIS — E1142 Type 2 diabetes mellitus with diabetic polyneuropathy: Secondary | ICD-10-CM | POA: Diagnosis not present

## 2023-01-30 DIAGNOSIS — N1831 Chronic kidney disease, stage 3a: Secondary | ICD-10-CM | POA: Diagnosis not present

## 2023-01-30 DIAGNOSIS — D649 Anemia, unspecified: Secondary | ICD-10-CM | POA: Diagnosis not present

## 2023-01-30 DIAGNOSIS — E1122 Type 2 diabetes mellitus with diabetic chronic kidney disease: Secondary | ICD-10-CM | POA: Diagnosis not present

## 2023-01-30 DIAGNOSIS — G309 Alzheimer's disease, unspecified: Secondary | ICD-10-CM | POA: Diagnosis not present

## 2023-02-03 DIAGNOSIS — H04123 Dry eye syndrome of bilateral lacrimal glands: Secondary | ICD-10-CM | POA: Diagnosis not present

## 2023-02-03 DIAGNOSIS — H401131 Primary open-angle glaucoma, bilateral, mild stage: Secondary | ICD-10-CM | POA: Diagnosis not present

## 2023-02-06 DIAGNOSIS — N1831 Chronic kidney disease, stage 3a: Secondary | ICD-10-CM | POA: Diagnosis not present

## 2023-02-06 DIAGNOSIS — K219 Gastro-esophageal reflux disease without esophagitis: Secondary | ICD-10-CM | POA: Diagnosis not present

## 2023-02-06 DIAGNOSIS — E1122 Type 2 diabetes mellitus with diabetic chronic kidney disease: Secondary | ICD-10-CM | POA: Diagnosis not present

## 2023-02-06 DIAGNOSIS — D649 Anemia, unspecified: Secondary | ICD-10-CM | POA: Diagnosis not present

## 2023-02-06 DIAGNOSIS — G309 Alzheimer's disease, unspecified: Secondary | ICD-10-CM | POA: Diagnosis not present

## 2023-02-06 DIAGNOSIS — Z0000 Encounter for general adult medical examination without abnormal findings: Secondary | ICD-10-CM | POA: Diagnosis not present

## 2023-02-06 DIAGNOSIS — E1142 Type 2 diabetes mellitus with diabetic polyneuropathy: Secondary | ICD-10-CM | POA: Diagnosis not present

## 2023-02-10 ENCOUNTER — Observation Stay (HOSPITAL_COMMUNITY)
Admission: EM | Admit: 2023-02-10 | Discharge: 2023-02-11 | Disposition: A | Discharge status: Home / Self Care | Payer: Medicare PPO | Attending: Family Medicine | Admitting: Family Medicine

## 2023-02-10 ENCOUNTER — Other Ambulatory Visit: Payer: Self-pay

## 2023-02-10 ENCOUNTER — Encounter (HOSPITAL_COMMUNITY): Payer: Self-pay | Admitting: Internal Medicine

## 2023-02-10 ENCOUNTER — Emergency Department (HOSPITAL_COMMUNITY): Payer: Medicare PPO

## 2023-02-10 DIAGNOSIS — I209 Angina pectoris, unspecified: Secondary | ICD-10-CM

## 2023-02-10 DIAGNOSIS — N1831 Chronic kidney disease, stage 3a: Secondary | ICD-10-CM | POA: Diagnosis not present

## 2023-02-10 DIAGNOSIS — R079 Chest pain, unspecified: Secondary | ICD-10-CM

## 2023-02-10 DIAGNOSIS — E785 Hyperlipidemia, unspecified: Secondary | ICD-10-CM | POA: Diagnosis present

## 2023-02-10 DIAGNOSIS — E1122 Type 2 diabetes mellitus with diabetic chronic kidney disease: Secondary | ICD-10-CM | POA: Diagnosis not present

## 2023-02-10 DIAGNOSIS — E119 Type 2 diabetes mellitus without complications: Secondary | ICD-10-CM | POA: Diagnosis not present

## 2023-02-10 DIAGNOSIS — F0390 Unspecified dementia without behavioral disturbance: Secondary | ICD-10-CM | POA: Diagnosis present

## 2023-02-10 DIAGNOSIS — R0789 Other chest pain: Principal | ICD-10-CM | POA: Insufficient documentation

## 2023-02-10 DIAGNOSIS — I2089 Other forms of angina pectoris: Secondary | ICD-10-CM | POA: Diagnosis not present

## 2023-02-10 DIAGNOSIS — Z8546 Personal history of malignant neoplasm of prostate: Secondary | ICD-10-CM | POA: Insufficient documentation

## 2023-02-10 DIAGNOSIS — D649 Anemia, unspecified: Secondary | ICD-10-CM | POA: Insufficient documentation

## 2023-02-10 DIAGNOSIS — R072 Precordial pain: Secondary | ICD-10-CM | POA: Diagnosis not present

## 2023-02-10 DIAGNOSIS — Z85828 Personal history of other malignant neoplasm of skin: Secondary | ICD-10-CM | POA: Diagnosis not present

## 2023-02-10 DIAGNOSIS — E1140 Type 2 diabetes mellitus with diabetic neuropathy, unspecified: Secondary | ICD-10-CM

## 2023-02-10 HISTORY — DX: Gastro-esophageal reflux disease without esophagitis: K21.9

## 2023-02-10 HISTORY — DX: Polyneuropathy, unspecified: G62.9

## 2023-02-10 HISTORY — DX: Postpolio syndrome: G14

## 2023-02-10 LAB — CBC
HCT: 36.8 % — ABNORMAL LOW (ref 39.0–52.0)
Hemoglobin: 11.9 g/dL — ABNORMAL LOW (ref 13.0–17.0)
MCH: 30.6 pg (ref 26.0–34.0)
MCHC: 32.3 g/dL (ref 30.0–36.0)
MCV: 94.6 fL (ref 80.0–100.0)
Platelets: 289 10*3/uL (ref 150–400)
RBC: 3.89 MIL/uL — ABNORMAL LOW (ref 4.22–5.81)
RDW: 13.3 % (ref 11.5–15.5)
WBC: 7.4 10*3/uL (ref 4.0–10.5)
nRBC: 0 % (ref 0.0–0.2)

## 2023-02-10 LAB — LIPID PANEL
Cholesterol: 135 mg/dL (ref 0–200)
HDL: 31 mg/dL — ABNORMAL LOW (ref >40)
LDL Cholesterol: 59 mg/dL (ref 0–99)
Total CHOL/HDL Ratio: 4.4 RATIO
Triglycerides: 224 mg/dL — ABNORMAL HIGH (ref <150)
VLDL: 45 mg/dL — ABNORMAL HIGH (ref 0–40)

## 2023-02-10 LAB — BASIC METABOLIC PANEL
Anion gap: 13 (ref 5–15)
BUN: 19 mg/dL (ref 8–23)
CO2: 22 mmol/L (ref 22–32)
Calcium: 9.2 mg/dL (ref 8.9–10.3)
Chloride: 99 mmol/L (ref 98–111)
Creatinine, Ser: 1.27 mg/dL — ABNORMAL HIGH (ref 0.61–1.24)
GFR, Estimated: 56 mL/min — ABNORMAL LOW (ref >60)
Glucose, Bld: 171 mg/dL — ABNORMAL HIGH (ref 70–99)
Potassium: 5 mmol/L (ref 3.5–5.1)
Sodium: 134 mmol/L — ABNORMAL LOW (ref 135–145)

## 2023-02-10 LAB — CBG MONITORING, ED
Glucose-Capillary: 170 mg/dL — ABNORMAL HIGH (ref 70–99)
Glucose-Capillary: 157 mg/dL — ABNORMAL HIGH (ref 70–99)

## 2023-02-10 LAB — TROPONIN I (HIGH SENSITIVITY)
Troponin I (High Sensitivity): 4 ng/L (ref <18)
Troponin I (High Sensitivity): 4 ng/L (ref <18)

## 2023-02-10 LAB — TSH: TSH: 1.673 u[IU]/mL (ref 0.350–4.500)

## 2023-02-10 LAB — HEMOGLOBIN A1C
Hgb A1c MFr Bld: 7.8 % — ABNORMAL HIGH (ref 4.8–5.6)
Mean Plasma Glucose: 177.16 mg/dL

## 2023-02-10 LAB — GLUCOSE, CAPILLARY: Glucose-Capillary: 169 mg/dL — ABNORMAL HIGH (ref 70–99)

## 2023-02-10 MED ORDER — ASPIRIN 81 MG PO TBEC: 81.0000 mg | DELAYED_RELEASE_TABLET | Freq: Every day | ORAL | Status: DC

## 2023-02-10 MED ORDER — INSULIN ASPART 100 UNIT/ML IJ SOLN: 0.0000 [IU] | Freq: Three times a day (TID) | INTRAMUSCULAR | Status: DC

## 2023-02-10 MED ORDER — ENOXAPARIN SODIUM 40 MG/0.4ML IJ SOSY
40.0000 mg | PREFILLED_SYRINGE | INTRAMUSCULAR | Status: DC
  Administered 2023-02-10: 40 mg via SUBCUTANEOUS
  Filled 2023-02-10: qty 0.4

## 2023-02-10 MED ORDER — DULOXETINE HCL 60 MG PO CPEP
60.0000 mg | ORAL_CAPSULE | Freq: Every day | ORAL | Status: DC
  Administered 2023-02-11: 60 mg via ORAL
  Filled 2023-02-10: qty 1

## 2023-02-10 MED ORDER — CLONAZEPAM 0.5 MG PO TABS
1.0000 mg | ORAL_TABLET | Freq: Every day | ORAL | Status: DC
  Administered 2023-02-10: 1 mg via ORAL
  Filled 2023-02-10: qty 2

## 2023-02-10 MED ORDER — SODIUM CHLORIDE 0.9 % IV SOLN: 250.0000 mL | INTRAVENOUS | Status: DC | PRN

## 2023-02-10 MED ORDER — ATORVASTATIN CALCIUM 40 MG PO TABS
40.0000 mg | ORAL_TABLET | Freq: Every day | ORAL | Status: DC
  Administered 2023-02-10: 40 mg via ORAL
  Filled 2023-02-10: qty 1

## 2023-02-10 MED ORDER — ACETAMINOPHEN 325 MG PO TABS
650.0000 mg | ORAL_TABLET | ORAL | Status: DC | PRN
  Administered 2023-02-11: 650 mg via ORAL
  Filled 2023-02-10: qty 2

## 2023-02-10 MED ORDER — NITROGLYCERIN 0.4 MG SL SUBL: 0.4000 mg | SUBLINGUAL_TABLET | SUBLINGUAL | Status: DC | PRN

## 2023-02-10 MED ORDER — METOPROLOL TARTRATE 25 MG PO TABS: 12.5000 mg | ORAL_TABLET | Freq: Two times a day (BID) | ORAL | Status: DC

## 2023-02-10 MED ORDER — CYCLOSPORINE 0.05 % OP EMUL
1.0000 [drp] | Freq: Two times a day (BID) | OPHTHALMIC | Status: DC
  Administered 2023-02-10 – 2023-02-11 (×2): 1 [drp] via OPHTHALMIC
  Filled 2023-02-10 (×4): qty 30

## 2023-02-10 MED ORDER — SODIUM CHLORIDE 0.9% FLUSH: 3.0000 mL | INTRAVENOUS | Status: DC | PRN

## 2023-02-10 MED ORDER — INSULIN ASPART 100 UNIT/ML IJ SOLN: 0.0000 [IU] | Freq: Every day | INTRAMUSCULAR | Status: DC

## 2023-02-10 MED ORDER — FAMOTIDINE 20 MG PO TABS
20.0000 mg | ORAL_TABLET | Freq: Two times a day (BID) | ORAL | Status: DC
  Administered 2023-02-10 – 2023-02-11 (×2): 20 mg via ORAL
  Filled 2023-02-10 (×2): qty 1

## 2023-02-10 MED ORDER — SODIUM CHLORIDE 0.9% FLUSH
3.0000 mL | Freq: Two times a day (BID) | INTRAVENOUS | Status: DC
  Administered 2023-02-10 – 2023-02-11 (×2): 3 mL via INTRAVENOUS

## 2023-02-10 MED ORDER — GABAPENTIN 300 MG PO CAPS
300.0000 mg | ORAL_CAPSULE | Freq: Every day | ORAL | Status: DC
  Administered 2023-02-11: 300 mg via ORAL
  Filled 2023-02-10: qty 1

## 2023-02-10 MED ORDER — ASPIRIN 81 MG PO TBEC
81.0000 mg | DELAYED_RELEASE_TABLET | Freq: Every day | ORAL | Status: DC
  Administered 2023-02-11: 81 mg via ORAL
  Filled 2023-02-10: qty 1

## 2023-02-10 MED ORDER — ONDANSETRON HCL 4 MG/2ML IJ SOLN: 4.0000 mg | Freq: Four times a day (QID) | INTRAMUSCULAR | Status: DC | PRN

## 2023-02-10 NOTE — ED Triage Notes (Signed)
Pt BIB [facility] with reports of chest and abdominal pain that started today. Pt denies N/V/D and SHOB. Pt denies pain at this time.

## 2023-02-10 NOTE — Consult Note (Signed)
Cardiology Consultation   Patient ID: Jeffery Stewart MRN: [id number]; DOB: Dec 03, 1935  Admit date: 02/10/2023 Date of Consult: 02/10/2023  PCP:  Irena Reichmann, DO   [facility] Providers Cardiologist:  None        Patient Profile:   Jeffery Stewart is a 87 y.o. male with a hx of type II diabetes who is being seen 02/10/2023 for the evaluation of chest pain at the request of Dr. Ophelia Charter.  History of Present Illness:   Jeffery Stewart is a very pleasant elderly gentleman, with wife and daughter at bedside. He denies any prior history of heart disease. He was working outside in the yard this AM when he noted chest pain. He notes it was between sharp and dull, focal in the mid epigastric area, nonradiating. No shortness of breath associated with it. He is not sure how long it lasted but thinks it was gone by the time he got to the ER. Has never had similar pain before. Not sure that anything made it better or worse.  Vitals were stable on arrival to the ER. ECG showed sinus bradycardia at 57 bpm. CXR with hypoinflation without acute changes. hsTN unremarkable x2.   Past Medical History:  Diagnosis Date   Dementia (HCC) 2012   Diabetes mellitus without complication (HCC)    GERD (gastroesophageal reflux disease)    Neuropathy    Post-polio syndrome    possible weakned intercostal muscles, fatigues easily    Past Surgical History:  Procedure Laterality Date   INSERTION PROSTATE RADIATION SEED  2007   MELANOMA EXCISION Left 2016   elbow     Home Medications:  Prior to Admission medications   Not on File    Inpatient Medications: Scheduled Meds:  [START ON 02/11/2023] aspirin EC  81 mg Oral Daily   atorvastatin  40 mg Oral q1800   clonazePAM  1 mg Oral QHS   cycloSPORINE  1 drop Both Eyes BID   [START ON 02/11/2023] DULoxetine  60 mg Oral Daily   enoxaparin (LOVENOX) injection  40 mg Subcutaneous Q24H   famotidine  20 mg Oral BID   [START ON 02/11/2023] gabapentin  300 mg  Oral Q0600   insulin aspart  0-15 Units Subcutaneous TID WC   insulin aspart  0-5 Units Subcutaneous QHS   sodium chloride flush  3 mL Intravenous Q12H   Continuous Infusions:  sodium chloride     PRN Meds: sodium chloride, acetaminophen, nitroGLYCERIN, ondansetron (ZOFRAN) IV, sodium chloride flush  Allergies:    Allergies  Allergen Reactions   Prednisone Other (See Comments)    Social History:   Social History   Socioeconomic History   Marital status: Married    Spouse name: Not on file   Number of children: Not on file   Years of education: Not on file   Highest education level: Not on file  Occupational History   Not on file  Tobacco Use   Smoking status: Never   Smokeless tobacco: Never  Substance and Sexual Activity   Alcohol use: Yes    Comment: occasional   Drug use: Never   Sexual activity: Not on file  Other Topics Concern   Not on file  Social History Narrative   Not on file   Social Determinants of Health   Financial Resource Strain: Not on file  Food Insecurity: No Food Insecurity (02/10/2023)   Hunger Vital Sign    Worried About Running Out of Food in the Last Year: Never true  Ran Out of Food in the Last Year: Never true  Transportation Needs: No Transportation Needs (02/10/2023)   PRAPARE - Administrator, Civil Service (Medical): No    Lack of Transportation (Non-Medical): No  Physical Activity: Not on file  Stress: Not on file  Social Connections: Not on file  Intimate Partner Violence: Not At Risk (02/10/2023)   Humiliation, Afraid, Rape, and Kick questionnaire    Fear of Current or Ex-Partner: No    Emotionally Abused: No    Physically Abused: No    Sexually Abused: No    Family History:    Family History  Problem Relation Age of Onset   CAD Brother    CAD Daughter 50     ROS:  Please see the history of present illness.  Constitutional: Negative for chills, fever, night sweats, unintentional weight loss  HENT:  Negative for ear pain and hearing loss.   Eyes: Negative for loss of vision and eye pain.  Respiratory: Negative for cough, sputum, wheezing.   Cardiovascular: See HPI. Gastrointestinal: Negative for abdominal pain, melena, and hematochezia.  Genitourinary: Negative for dysuria and hematuria.  Musculoskeletal: Negative for falls and myalgias.  Skin: Negative for itching and rash.  Neurological: Negative for focal weakness, focal sensory changes and loss of consciousness.  Endo/Heme/Allergies: Does not bruise/bleed easily.   All other ROS reviewed and negative.     Physical Exam/Data:   Vitals:   02/10/23 1730 02/10/23 1746 02/10/23 1748 02/10/23 1800  BP: (!) 142/63   (!) 142/63  Pulse: 68   64  Resp: (!) 21   (!) 21  Temp:   (!) 97.5 F (36.4 C)   TempSrc:   Oral   SpO2: 100%   98%  Weight:  70.3 kg    Height:  5\' 6"  (1.676 m)     No intake or output data in the 24 hours ending 02/10/23 1846    02/10/2023    5:46 PM  Last 3 Weights  Weight (lbs) 155 lb  Weight (kg) 70.308 kg     Body mass index is 25.02 kg/m.  General:  Well nourished, well developed, in no acute distress HEENT: normal Neck: no JVD Vascular: No carotid bruits; Distal pulses 2+ bilaterally Cardiac:  normal S1, S2; RRR; no murmur  Lungs:  clear to auscultation bilaterally, no wheezing, rhonchi or rales  Abd: soft, nontender, no hepatomegaly  Ext: no edema Musculoskeletal:  No deformities, BUE and BLE strength normal and equal Skin: warm and dry  Neuro:  CNs 2-12 intact, no focal abnormalities noted Psych:  Normal affect   EKG:  The EKG was personally reviewed and demonstrates:  sinus bradycardia at 57 bpm Telemetry:  Telemetry was personally reviewed and demonstrates:  sinus bradycardia with intermittent PACs  Relevant CV Studies: None  Laboratory Data:  High Sensitivity Troponin:   Recent Labs  Lab 02/10/23 1316 02/10/23 1502  TROPONINIHS 4 4     Chemistry Recent Labs  Lab  02/10/23 1316  NA 134*  K 5.0  CL 99  CO2 22  GLUCOSE 171*  BUN 19  CREATININE 1.27*  CALCIUM 9.2  GFRNONAA 56*  ANIONGAP 13    No results for input(s): "PROT", "ALBUMIN", "AST", "ALT", "ALKPHOS", "BILITOT" in the last 168 hours. Lipids  Recent Labs  Lab 02/10/23 1502  CHOL 135  TRIG 224*  HDL 31*  LDLCALC 59  CHOLHDL 4.4    Hematology Recent Labs  Lab 02/10/23 1316  WBC 7.4  RBC 3.89*  HGB 11.9*  HCT 36.8*  MCV 94.6  MCH 30.6  MCHC 32.3  RDW 13.3  PLT 289   Thyroid No results for input(s): "TSH", "FREET4" in the last 168 hours.  BNPNo results for input(s): "BNP", "PROBNP" in the last 168 hours.  DDimer No results for input(s): "DDIMER" in the last 168 hours.   Radiology/Studies:  DG Chest 2 View  Result Date: 02/10/2023 CLINICAL DATA:  Chest and abdominal pain beginning today. EXAM: CHEST - 2 VIEW COMPARISON:  None Available. FINDINGS: Lungs are hypoinflated and otherwise clear. Cardiomediastinal silhouette is normal. Mild degenerative change of the spine. IMPRESSION: Hypoinflation without acute cardiopulmonary disease. Electronically Signed   By: Elberta Fortis M.D.   On: 02/10/2023 14:13     Assessment and Plan:   Chest pain -hsTn 4, 4 -LDL 59, TG 224, HDL 31, Tchol 135 -echo pending -ECG with sinus bradycardia, no acute ischemic changes -has risk factor of diabetes -if echo significantly abnormal, would pursue cath for further evaluation -if echo largely normal, would pursue outpatient testing -given heart rate, would be a good candidate for coronary CT. Not available today, will see if available tomorrow, otherwise could be done as an outpatient. Issue may be calcification. No chest CT available from prior, but can see outline of vessels on chest x-ray, suggesting significant calcification. Outpatient nuclear stress test or cardiac PET would also be a good option -with history of GERD, cannot exclude GI etiology -has been started on aspirin and  atorvastatin inpatient, was on pravastatin as outpatient  Anemia -Hgb was 12.4 2 years ago, 11.9 today. MCV 94. Denies knowing any prior history of this. No known bleeding.  Chronic kidney disease, stage 3a -Cr 1.27 today, 1.30 two years ago  Type II diabetes -A1c pending  Risk Assessment/Risk Scores:                For questions or updates, please contact [facility] Please consult [email or web address] for contact info under    Signed, Jodelle Red, MD  02/10/2023 6:46 PM

## 2023-02-10 NOTE — H&P (Signed)
History and Physical    Patient: Jeffery Stewart [name]:[id number] DOB: 03-30-1936 DOA: 02/10/2023 DOS: the patient was seen and examined on 02/10/2023 PCP: Irena Reichmann, DO  Patient coming from: Home - lives with wife; NOK: Wife, [phone]   Chief Complaint: Chest pain  HPI: Jeffery Stewart is a 87 y.o. male with medical history significant of DM presenting with chest pain.  He reports pain in his chest and abdomen.  He felt some nausea, a little different.  They were putting solar lights out on rocks in the front yard when the pain started.  Pain improved with rest, gone completely without recurrence.  Substernal pain and peri-umbilical feeling like nausea (following chest pain).  He felt like there was a large piece of wood in his chest, told family that it felt like "3 of his granddaughters were sitting on his chest."  No SOB or other symptoms.  No prior h/o cardiac issues, has GERD.  Remote h/o stress test (5-6 years) in [place].      ER Course:  CP with exertion, improved with rest, resolved completely.  Took 4 ASA.  Troponin negative x 1.       Review of Systems: As mentioned in the history of present illness. All other systems reviewed and are negative. Past Medical History:  Diagnosis Date   Dementia (HCC) 2012   Diabetes mellitus without complication (HCC)    GERD (gastroesophageal reflux disease)    Neuropathy    Post-polio syndrome    possible weakned intercostal muscles, fatigues easily   Past Surgical History:  Procedure Laterality Date   INSERTION PROSTATE RADIATION SEED  2007   MELANOMA EXCISION Left 2016   elbow   Social History:  reports that he has never smoked. He has never used smokeless tobacco. He reports current alcohol use. He reports that he does not use drugs.  Allergies  Allergen Reactions   Prednisone Other (See Comments)    Family History  Problem Relation Age of Onset   CAD Brother    CAD Daughter 19    Prior to Admission medications   Not  on File    Physical Exam: Vitals:   02/10/23 1630 02/10/23 1700 02/10/23 1746 02/10/23 1748  BP: (!) 155/61 (!) 144/69    Pulse: 70 72    Resp: (!) 23 18    Temp:    (!) 97.5 F (36.4 C)  TempSrc:    Oral  SpO2: 100% 100%    Weight:   70.3 kg   Height:   5\' 6"  (1.676 m)    General:  Appears calm and comfortable and is in NAD Eyes:  EOMI, normal lids, iris ENT:  grossly normal hearing, lips & tongue, mmm Neck:  no LAD, masses or thyromegaly Cardiovascular:  RRR, no m/r/g. No LE edema.  Respiratory:   CTA bilaterally with no wheezes/rales/rhonchi.  Normal respiratory effort. Abdomen:  soft, NT, ND Skin:  no rash or induration seen on limited exam Musculoskeletal:  grossly normal tone BUE/BLE, good ROM, no bony abnormality Psychiatric:  pleasant but mildly confused mood and affect, speech fluent and appropriate, AOx2 Neurologic:  CN 2-12 grossly intact, moves all extremities in coordinated fashion   Radiological Exams on Admission: Independently reviewed - see discussion in A/P where applicable  DG Chest 2 View  Result Date: 02/10/2023 CLINICAL DATA:  Chest and abdominal pain beginning today. EXAM: CHEST - 2 VIEW COMPARISON:  None Available. FINDINGS: Lungs are hypoinflated and otherwise clear. Cardiomediastinal silhouette is normal. Mild degenerative  change of the spine. IMPRESSION: Hypoinflation without acute cardiopulmonary disease. Electronically Signed   By: Elberta Fortis M.D.   On: 02/10/2023 14:13    EKG: Independently reviewed.  Sinus bradycardia with rate 57; no evidence of acute ischemia   Labs on Admission: I have personally reviewed the available labs and imaging studies at the time of the admission.  Pertinent labs:    Glucose 171 BUN 19/Creatinine 1.27/GFR 56 - stable  HS troponin 4, 4 Unremarkable CBC Lipids 135/31/59/224 A1c    Assessment and Plan: Principal Problem:   Angina pectoris (HCC) Active Problems:   Dementia (HCC)   Diabetes mellitus  without complication (HCC)   Dyslipidemia   Chronic kidney disease, stage 3a (HCC)    Angina -Patient with substernal chest pressure that came on with exertion and was relieved with rest, concerning for stable angina. -CXR unremarkable.   -Initial cardiac HS troponin negative.  -EKG not indicative of acute ischemia.   -Will plan to place in observation status on telemetry to rule out ACS by overnight observation.  -Repeat EKG in AM -Will order echo -Start ASA 81 mg daily -Risk factor stratification with HgbA1c and FLP; will also check TSH and lipoprotein A -Cardiology consultation  -Would ideally start metoprolol but patient has baseline bradycardia so will hold for now  HLD -Change Pravastatin to Atorvastatin  DM -Will check A1c -hold Janumet -Cover with moderate-scale SSI   Dementia -Continue nightly Klonopin, daily Cymbalta -Delirium precautions  -Family reports no issues with behavioral disturbance  Stage 3a CKD -Appears to be stable at this time -Attempt to avoid nephrotoxic medications -Recheck BMP in AM    Advance Care Planning:   Code Status: Full Code - Code status was discussed with the patient and/or family at the time of admission.  The patient would want to receive full resuscitative measures at this time.   Consults: Cardiology  DVT Prophylaxis: Lovenox  Family Communication: Wife and daughter were present throughout evaluation  Severity of Illness: The appropriate patient status for this patient is OBSERVATION. Observation status is judged to be reasonable and necessary in order to provide the required intensity of service to ensure the patient's safety. The patient's presenting symptoms, physical exam findings, and initial radiographic and laboratory data in the context of their medical condition is felt to place them at decreased risk for further clinical deterioration. Furthermore, it is anticipated that the patient will be medically stable for  discharge from the hospital within 2 midnights of admission.   Author: Jonah Blue, MD 02/10/2023 6:05 PM  For on call review [email or web address].

## 2023-02-10 NOTE — ED Notes (Signed)
Patient denies chest pain at this time 

## 2023-02-10 NOTE — ED Notes (Signed)
Patient called for triage x3 

## 2023-02-10 NOTE — ED Provider Notes (Addendum)
[place] EMERGENCY DEPARTMENT AT [facility] Provider Note   CSN: [id number] Arrival date & time: 02/10/23  1255     History  Diabetes   Chief Complaint  Patient presents with   Chest Pain    Jeffery Stewart is a 87 y.o. male with a past medical history of diabetes who presents with chest pain that started earlier today after working in the yard.  Chest discomfort started after patient started to perform some light yard work. He subsequently rested and the chest pain initially worsened, but then improved and has since resolved. Describes the discomfort as a pressure-like sensation. Denies palpitations, lightheadedness, dizziness, diaphoresis, nausea, pain radiation, breath shortness, and recent illnesses. Took four aspirins en route to the emergency department. Family history notable for myocardial infarction in at least two family members including his daughter. Most recent lipid panel collected 12-2021 demonstrated LDL 64 and mildly elevated triglyceride level. Never smoked tobacco, has a remote history of second-hand exposure.    Chest Pain    Home Medications Prior to Admission medications   Not on File      Allergies    Prednisone    Review of Systems   Review of Systems  Cardiovascular:  Positive for chest pain.    Physical Exam Updated Vital Signs BP (!) 145/63   Pulse (!) 56   Temp 98.2 F (36.8 C)   Resp 20   SpO2 100%  Physical Exam  Alert and awake, fully oriented but poor historian, lying comfortably in bed, no acute distress Regular rate and rhythm, normal S1 and S2, no murmurs or gallops Skin turgor slightly decreased, capillary refill ~2 seconds Abdomen soft and non-distended, mild tenderness to palpation LLQ, no rigidity or guarding   ED Results / Procedures / Treatments   Labs (all labs ordered are listed, but only abnormal results are displayed) Labs Reviewed  BASIC METABOLIC PANEL - Abnormal; Notable for the following components:       Result Value   Sodium 134 (*)    Glucose, Bld 171 (*)    Creatinine, Ser 1.27 (*)    GFR, Estimated 56 (*)    All other components within normal limits  CBC - Abnormal; Notable for the following components:   RBC 3.89 (*)    Hemoglobin 11.9 (*)    HCT 36.8 (*)    All other components within normal limits  CBG MONITORING, ED - Abnormal; Notable for the following components:   Glucose-Capillary 170 (*)    All other components within normal limits  LIPID PANEL  TROPONIN I (HIGH SENSITIVITY)  TROPONIN I (HIGH SENSITIVITY)    EKG EKG Interpretation  Date/Time:  Monday Feb 10 2023 12:42:48 EDT Ventricular Rate:  57 PR Interval:  174 QRS Duration: 74 QT Interval:  404 QTC Calculation: 393 R Axis:   -25 Text Interpretation: Sinus bradycardia Otherwise normal ECG When compared with ECG of 28-Jan-2021 08:59, PREVIOUS ECG IS PRESENT No significant change since last tracing Confirmed by Gwyneth Sprout ([id number]) on 02/10/2023 3:14:50 PM  Radiology DG Chest 2 View  Result Date: 02/10/2023 CLINICAL DATA:  Chest and abdominal pain beginning today. EXAM: CHEST - 2 VIEW COMPARISON:  None Available. FINDINGS: Lungs are hypoinflated and otherwise clear. Cardiomediastinal silhouette is normal. Mild degenerative change of the spine. IMPRESSION: Hypoinflation without acute cardiopulmonary disease. Electronically Signed   By: Elberta Fortis M.D.   On: 02/10/2023 14:13    Procedures Procedures   None   Medications Ordered in ED Medications -  No data to display  ED Course/ Medical Decision Making/ A&P             HEART Score: 5                Medical Decision Making Amount and/or Complexity of Data Reviewed Labs: ordered. Radiology: ordered.  Risk Decision regarding hospitalization.   Patient presented with pressure-like, non-radiating chest pain that started during light exertion and then gradually resolved after resting. Took four aspirins en route to the emergency department.  Upon arrival, electrocardiogram was unremarkable without any acute ischemic changes and troponin x1 within normal limits. Presentation consistent with stable angina. High suspicion for acute coronary syndrome given pressure-like quality, exertional component, concurrent diabetes, and family history notable of myocardial infarction. Plan to admit patient for observation and further diagnostic workup.    Final Clinical Impression(s) / ED Diagnoses Final diagnoses:  Stable angina    Rx / DC Orders ED Discharge Orders     None         Crissie Sickles, MD 02/10/23 1530    Crissie Sickles, MD 02/10/23 1533    Gwyneth Sprout, MD 02/10/23 1542

## 2023-02-10 NOTE — ED Notes (Signed)
ED TO INPATIENT HANDOFF REPORT  ED Nurse Name and Phone #: cori [phone]  S Name/Age/Gender Jeffery Stewart 87 y.o. male Room/Bed: 039C/039C  Code Status   Code Status: Full Code  Home/SNF/Other Home Patient oriented to: self, place, and situation Is this baseline? Yes   Triage Complete: Triage complete  Chief Complaint ACS (acute coronary syndrome) (HCC) [I24.9]  Triage Note Pt BIB [facility] with reports of chest and abdominal pain that started today. Pt denies N/V/D and SHOB. Pt denies pain at this time.    Allergies Allergies  Allergen Reactions   Prednisone Other (See Comments)    Level of Care/Admitting Diagnosis ED Disposition     ED Disposition  Admit   Condition  --   Comment  Hospital Area: [facility] [[id number]] Level of Care: Telemetry Cardiac [103] May place patient in observation at [facility] or [facility] if equivalent level of care is available:: Yes Covid Evaluation: Asymptomatic - no recent exp osure (last 10 days) testing not required Diagnosis: ACS (acute coronary syndrome) [facility]) [[id number]] Admitting Physician: Jonah Blue [[id number]] Attending Physician: Jonah Blue [[id number]]          B Medical/Surgery History Past Medical History:  Diagnosis Date   Dementia (HCC) 2012   Diabetes mellitus without complication (HCC)    GERD (gastroesophageal reflux disease)    Neuropathy    Post-polio syndrome    possible weakned intercostal muscles, fatigues easily   Past Surgical History:  Procedure Laterality Date   INSERTION PROSTATE RADIATION SEED  2007   MELANOMA EXCISION Left 2016   elbow     A IV Location/Drains/Wounds Patient Lines/Drains/Airways Status     Active Line/Drains/Airways     None            Intake/Output Last 24 hours No intake or output data in the 24 hours ending 02/10/23 1731  Labs/Imaging Results for orders placed or performed during the hospital encounter of 02/10/23 (from the past 48 hour(s))   Basic metabolic panel     Status: Abnormal   Collection Time: 02/10/23  1:16 PM  Result Value Ref Range   Sodium 134 (L) 135 - 145 mmol/L   Potassium 5.0 3.5 - 5.1 mmol/L   Chloride 99 98 - 111 mmol/L   CO2 22 22 - 32 mmol/L   Glucose, Bld 171 (H) 70 - 99 mg/dL    Comment: Glucose reference range applies only to samples taken after fasting for at least 8 hours.   BUN 19 8 - 23 mg/dL   Creatinine, Ser 7.84 (H) 0.61 - 1.24 mg/dL   Calcium 9.2 8.9 - 69.6 mg/dL   GFR, Estimated 56 (L) >60 mL/min    Comment: (NOTE) Calculated using the CKD-EPI Creatinine Equation (2021)    Anion gap 13 5 - 15    Comment: Performed at [facility], [address], [place], [place] [address]  CBC     Status: Abnormal   Collection Time: 02/10/23  1:16 PM  Result Value Ref Range   WBC 7.4 4.0 - 10.5 K/uL   RBC 3.89 (L) 4.22 - 5.81 MIL/uL   Hemoglobin 11.9 (L) 13.0 - 17.0 g/dL   HCT 41.3 (L) 24.4 - 01.0 %   MCV 94.6 80.0 - 100.0 fL   MCH 30.6 26.0 - 34.0 pg   MCHC 32.3 30.0 - 36.0 g/dL   RDW 27.2 53.6 - 64.4 %   Platelets 289 150 - 400 K/uL   nRBC 0.0 0.0 -  0.2 %    Comment: Performed at [facility], [address] [place], [place] [address]  Troponin I (High Sensitivity)     Status: None   Collection Time: 02/10/23  1:16 PM  Result Value Ref Range   Troponin I (High Sensitivity) 4 <18 ng/L    Comment: (NOTE) Elevated high sensitivity troponin I (hsTnI) values and significant  changes across serial measurements may suggest ACS but many other  chronic and acute conditions are known to elevate hsTnI results.  Refer to the "Links" section for chest pain algorithms and additional  guidance. Performed at [facility], [address] [place], [place] [address]   CBG monitoring, ED     Status: Abnormal   Collection Time: 02/10/23  1:18 PM  Result Value Ref Range   Glucose-Capillary 170 (H) 70 - 99 mg/dL    Comment: Glucose reference range applies only to samples taken after  fasting for at least 8 hours.  Troponin I (High Sensitivity)     Status: None   Collection Time: 02/10/23  3:02 PM  Result Value Ref Range   Troponin I (High Sensitivity) 4 <18 ng/L    Comment: (NOTE) Elevated high sensitivity troponin I (hsTnI) values and significant  changes across serial measurements may suggest ACS but many other  chronic and acute conditions are known to elevate hsTnI results.  Refer to the "Links" section for chest pain algorithms and additional  guidance. Performed at [facility], [address] [place], [place] [address]   Lipid panel     Status: Abnormal   Collection Time: 02/10/23  3:02 PM  Result Value Ref Range   Cholesterol 135 0 - 200 mg/dL   Triglycerides 578 (H) <150 mg/dL   HDL 31 (L) >46 mg/dL   Total CHOL/HDL Ratio 4.4 RATIO   VLDL 45 (H) 0 - 40 mg/dL   LDL Cholesterol 59 0 - 99 mg/dL    Comment:        Total Cholesterol/HDL:CHD Risk Coronary Heart Disease Risk Table                     Men   Women  1/2 Average Risk   3.4   3.3  Average Risk       5.0   4.4  2 X Average Risk   9.6   7.1  3 X Average Risk  23.4   11.0        Use the calculated Patient Ratio above and the CHD Risk Table to determine the patient's CHD Risk.        ATP III CLASSIFICATION (LDL):  <100     mg/dL   Optimal  962-952  mg/dL   Near or Above                    Optimal  130-159  mg/dL   Borderline  841-324  mg/dL   High  >401     mg/dL   Very High Performed at [facility], [address] [place], [place] [address]   CBG monitoring, ED     Status: Abnormal   Collection Time: 02/10/23  5:23 PM  Result Value Ref Range   Glucose-Capillary 157 (H) 70 - 99 mg/dL    Comment: Glucose reference range applies only to samples taken after fasting for at least 8 hours.   DG Chest 2 View  Result Date: 02/10/2023 CLINICAL DATA:  Chest and  abdominal pain beginning today. EXAM: CHEST - 2 VIEW COMPARISON:  None Available. FINDINGS: Lungs are hypoinflated and  otherwise clear. Cardiomediastinal silhouette is normal. Mild degenerative change of the spine. IMPRESSION: Hypoinflation without acute cardiopulmonary disease. Electronically Signed   By: Elberta Fortis M.D.   On: 02/10/2023 14:13    Pending Labs Unresulted Labs (From admission, onward)     Start     Ordered   02/11/23 0500  Lipoprotein A (LPA)  Tomorrow morning,   R        02/10/23 1644   02/11/23 0500  Basic metabolic panel  Tomorrow morning,   R        02/10/23 1644   02/11/23 0500  CBC  Tomorrow morning,   R        02/10/23 1644   02/10/23 1644  Hemoglobin A1c  Once,   R        02/10/23 1644   02/10/23 1644  TSH  Once,   R        02/10/23 1644            Vitals/Pain Today's Vitals   02/10/23 1545 02/10/23 1630 02/10/23 1700 02/10/23 1724  BP: 131/86 (!) 155/61 (!) 144/69   Pulse: (!) 56 70 72   Resp: (!) 21 (!) 23 18   Temp:      SpO2: 100% 100% 100%   PainSc:    0-No pain    Isolation Precautions No active isolations  Medications Medications  enoxaparin (LOVENOX) injection 40 mg (has no administration in time range)  sodium chloride flush (NS) 0.9 % injection 3 mL (has no administration in time range)  sodium chloride flush (NS) 0.9 % injection 3 mL (has no administration in time range)  0.9 %  sodium chloride infusion (has no administration in time range)  aspirin EC tablet 81 mg (has no administration in time range)  nitroGLYCERIN (NITROSTAT) SL tablet 0.4 mg (has no administration in time range)  atorvastatin (LIPITOR) tablet 40 mg (has no administration in time range)  acetaminophen (TYLENOL) tablet 650 mg (has no administration in time range)  ondansetron (ZOFRAN) injection 4 mg (has no administration in time range)  insulin aspart (novoLOG) injection 0-15 Units (has no administration in time range)  metoprolol tartrate (LOPRESSOR) tablet 12.5 mg (has no administration in time range)  insulin aspart (novoLOG) injection 0-5 Units (has no administration in  time range)    Mobility walks with device     Focused Assessments Cardiac Assessment Handoff:  Cardiac Rhythm: Sinus bradycardia No results found for: "CKTOTAL", "CKMB", "CKMBINDEX", "TROPONINI" No results found for: "DDIMER" Does the Patient currently have chest pain? No    R Recommendations: See Admitting Provider Note  Report given to:   Additional Notes: n/a

## 2023-02-11 ENCOUNTER — Observation Stay (HOSPITAL_BASED_OUTPATIENT_CLINIC_OR_DEPARTMENT_OTHER): Payer: Medicare PPO

## 2023-02-11 ENCOUNTER — Other Ambulatory Visit: Payer: Self-pay | Admitting: Physician Assistant

## 2023-02-11 DIAGNOSIS — I209 Angina pectoris, unspecified: Secondary | ICD-10-CM | POA: Diagnosis not present

## 2023-02-11 DIAGNOSIS — R079 Chest pain, unspecified: Secondary | ICD-10-CM | POA: Diagnosis not present

## 2023-02-11 DIAGNOSIS — N1831 Chronic kidney disease, stage 3a: Secondary | ICD-10-CM | POA: Diagnosis not present

## 2023-02-11 DIAGNOSIS — R072 Precordial pain: Secondary | ICD-10-CM | POA: Diagnosis not present

## 2023-02-11 DIAGNOSIS — E1122 Type 2 diabetes mellitus with diabetic chronic kidney disease: Secondary | ICD-10-CM | POA: Diagnosis not present

## 2023-02-11 DIAGNOSIS — E119 Type 2 diabetes mellitus without complications: Secondary | ICD-10-CM

## 2023-02-11 LAB — CBC
HCT: 31 % — ABNORMAL LOW (ref 39.0–52.0)
Hemoglobin: 10.5 g/dL — ABNORMAL LOW (ref 13.0–17.0)
MCH: 30.4 pg (ref 26.0–34.0)
MCHC: 33.9 g/dL (ref 30.0–36.0)
MCV: 89.9 fL (ref 80.0–100.0)
Platelets: 260 10*3/uL (ref 150–400)
RBC: 3.45 MIL/uL — ABNORMAL LOW (ref 4.22–5.81)
RDW: 13.2 % (ref 11.5–15.5)
WBC: 5.8 10*3/uL (ref 4.0–10.5)
nRBC: 0 % (ref 0.0–0.2)

## 2023-02-11 LAB — BASIC METABOLIC PANEL
Anion gap: 8 (ref 5–15)
BUN: 18 mg/dL (ref 8–23)
CO2: 22 mmol/L (ref 22–32)
Calcium: 8.8 mg/dL — ABNORMAL LOW (ref 8.9–10.3)
Chloride: 102 mmol/L (ref 98–111)
Creatinine, Ser: 1.05 mg/dL (ref 0.61–1.24)
GFR, Estimated: >60 mL/min (ref >60)
Glucose, Bld: 174 mg/dL — ABNORMAL HIGH (ref 70–99)
Potassium: 4.1 mmol/L (ref 3.5–5.1)
Sodium: 132 mmol/L — ABNORMAL LOW (ref 135–145)

## 2023-02-11 LAB — ECHOCARDIOGRAM COMPLETE
AR max vel: 2.7 cm2
AV Area VTI: 2.13 cm2
AV Area mean vel: 2.66 cm2
AV Mean grad: 2 mmHg
AV Peak grad: 4 mmHg
Ao pk vel: 1 m/s
Area-P 1/2: 3.1 cm2
Height: 66 in
S' Lateral: 2.7 cm
Single Plane A4C EF: 70.5 %
Weight: 2444.8 oz

## 2023-02-11 LAB — GLUCOSE, CAPILLARY
Glucose-Capillary: 120 mg/dL — ABNORMAL HIGH (ref 70–99)
Glucose-Capillary: 143 mg/dL — ABNORMAL HIGH (ref 70–99)

## 2023-02-11 MED ORDER — NITROGLYCERIN 0.4 MG SL SUBL: 0.4000 mg | SUBLINGUAL_TABLET | SUBLINGUAL | 12 refills | Status: AC | PRN

## 2023-02-11 MED ORDER — ASPIRIN 81 MG PO TBEC: 81.0000 mg | DELAYED_RELEASE_TABLET | Freq: Every day | ORAL | 12 refills | Status: DC

## 2023-02-11 MED ORDER — PERFLUTREN LIPID MICROSPHERE
1.0000 mL | INTRAVENOUS | Status: AC | PRN
  Administered 2023-02-11: 2 mL via INTRAVENOUS

## 2023-02-11 MED ORDER — ATORVASTATIN CALCIUM 40 MG PO TABS: 40.0000 mg | ORAL_TABLET | Freq: Every day | ORAL | 12 refills | Status: DC

## 2023-02-11 NOTE — Discharge Summary (Signed)
Physician Discharge Summary  Jeffery Stewart [name]:[id number] DOB: Dec 29, 1935 DOA: 02/10/2023  PCP: Irena Reichmann, DO  Admit date: 02/10/2023 Discharge date: 02/11/2023  Time spent: 25 minutes  Recommendations for Outpatient Follow-up:  Follow with primary care in 1 week for routine screening labs Outpatient follow-up Dr. Jodelle Red for further follow-up of heart issues  Discharge Diagnoses:  MAIN problem for hospitalization   Concern for angina  Please see below for itemized issues addressed in HOpsital- refer to other progress notes for clarity if needed  Discharge Condition: Good  Diet recommendation: Heart healthy  Filed Weights   02/10/23 1746 02/10/23 1905 02/11/23 0526  Weight: 70.3 kg 69.7 kg 69.3 kg    History of present illness:  87 year old home dwelling male DM TY 2-current A1c 7.8 Reflux HLD Prior prostate cancer CKD stage III Follows with neurology Sherlean Foot MD for Alzheimer's PTSD RLS   Presented to [facility], ED after working in the yard on 5/13+ chest pain sharp/dull focal midepigastric no red, - SOB unclear duration EKG sinus bradycardia 57 CXR no acute findings Echo ordered showing no wall motion abnormalities, normal valves and no wall motion issues Cardiology consulted and made decisions based on the echo and felt patient was stable for outpatient follow-up for consideration of either advanced imaging of cardia versus outpatient invasive testing which will be determined as per them  Patient will go home on higher dose intensity statin daily aspirin nitroglycerin which have all called into his pharmacy I had a reasonably detailed discussion with his daughter prior to the echo coming back and she is aware that cardiology will update her prior to discharge    Discharge Exam: Vitals:   02/11/23 0712 02/11/23 1126  BP: (!) 145/71 137/66  Pulse: 62 (!) 49  Resp: 20 18  Temp: 97.7 F (36.5 C) 98.3 F (36.8 C)  SpO2: 93% 98%    Subj on  day of d/c   Coherent pleasant no distress S1-S2 no murmur rub or gallop--pain is not reproducible with shoulder manipulation or chest wall pressure Chest clear no wheeze Abdomen soft no rebound no guarding ROM intact   Discharge Instructions   Discharge Instructions     Diet - low sodium heart healthy   Complete by: As directed    Discharge instructions   Complete by: As directed    You were found this hospital stay did not have any large areas of poorly functioning heart muscle which is quite reassuring--this discomfort that you are feeling in your chest can be addressed in the outpatient setting with your cardiologist who may elect to do further testing\you have however menance to hearted on aspirin Lipitor in the event that this was something cardiac and if you have chest pain similar or discomfort in her chest similar to what you had several days ago please take nitroglycerin and put it under your tongue Please stop taking ibuprofen if you are taking it before as this can sometimes cause heartburn or issues that can mimic heart pain  Best of luck please follow-up with the cardiologist in the timeframe that they schedule and your primary physician in 1 week to get routine screening labs   Increase activity slowly   Complete by: As directed       Allergies as of 02/11/2023       Reactions   Prednisone Other (See Comments)        Medication List     STOP taking these medications    ibuprofen 200 MG  tablet Commonly known as: ADVIL   pravastatin 40 MG tablet Commonly known as: PRAVACHOL       TAKE these medications    acetaminophen 500 MG tablet Commonly known as: TYLENOL Take 500 mg by mouth daily as needed for mild pain or moderate pain. Take as needed for back/neck pain.   aspirin EC 81 MG tablet Take 1 tablet (81 mg total) by mouth daily. Swallow whole. Start taking on: Feb 12, 2023   atorvastatin 40 MG tablet Commonly known as: LIPITOR Take 1 tablet  (40 mg total) by mouth daily at 6 PM.   b complex vitamins capsule Take 1 capsule by mouth daily.   clonazePAM 1 MG tablet Commonly known as: KLONOPIN Take 1 mg by mouth at bedtime.   cycloSPORINE 0.05 % ophthalmic emulsion Commonly known as: RESTASIS Place 1 drop into both eyes 2 (two) times daily.   DULoxetine 60 MG capsule Commonly known as: CYMBALTA Take 60 mg by mouth daily.   famotidine 20 MG tablet Commonly known as: PEPCID Take 20 mg by mouth 2 (two) times daily.   gabapentin 300 MG capsule Commonly known as: NEURONTIN Take 300 mg by mouth daily at 6 (six) AM. Patients take 7 capsules a day.   lidocaine 5 % Commonly known as: LIDODERM Place 1 patch onto the skin daily. Remove & Discard patch within 12 hours or as directed by MD   nitroGLYCERIN 0.4 MG SL tablet Commonly known as: NITROSTAT Place 1 tablet (0.4 mg total) under the tongue every 5 (five) minutes x 3 doses as needed for chest pain.   sitaGLIPtin-metformin 50-1000 MG tablet Commonly known as: JANUMET Take 1 tablet by mouth 2 (two) times daily with a meal.       Allergies  Allergen Reactions   Prednisone Other (See Comments)      The results of significant diagnostics from this hospitalization (including imaging, microbiology, ancillary and laboratory) are listed below for reference.    Significant Diagnostic Studies: ECHOCARDIOGRAM COMPLETE  Result Date: 02/11/2023    ECHOCARDIOGRAM REPORT   Patient Name:   Jeffery Stewart Date of Exam: 02/11/2023 Medical Rec #:  [id number]    Height:       66.0 in Accession #:    [id number]   Weight:       152.8 lb Date of Birth:  September 20, 1936    BSA:          1.784 m Patient Age:    86 years     BP:           145/71 mmHg Patient Gender: M            HR:           48 bpm. Exam Location:  Inpatient Procedure: 2D Echo, Cardiac Doppler, Color Doppler and Intracardiac            Opacification Agent Indications:    Chest pain  History:        Patient has no prior history of  Echocardiogram examinations.                 Risk Factors:Dyslipidemia and Diabetes.  Sonographer:    Lucy Antigua Referring Phys: [id number] JENNIFER YATES IMPRESSIONS  1. Left ventricular ejection fraction, by estimation, is 55 to 60%. The left ventricle has normal function. The left ventricle has no regional wall motion abnormalities. Left ventricular diastolic parameters are indeterminate.  2. Right ventricular systolic function is normal. The right ventricular size is mildly enlarged.  3. The mitral valve is normal in structure. Trivial mitral valve regurgitation. No evidence of mitral stenosis.  4. The aortic valve is normal in structure. Aortic valve regurgitation is not visualized. No aortic stenosis is present.  5. There is borderline dilatation of the ascending aorta, measuring 37 mm.  6. The inferior vena cava is normal in size with greater than 50% respiratory variability, suggesting right atrial pressure of 3 mmHg. FINDINGS  Left Ventricle: Left ventricular ejection fraction, by estimation, is 55 to 60%. The left ventricle has normal function. The left ventricle has no regional wall motion abnormalities. The left ventricular internal cavity size was normal in size. There is  no left ventricular hypertrophy. Left ventricular diastolic parameters are indeterminate. Right Ventricle: The right ventricular size is mildly enlarged. No increase in right ventricular wall thickness. Right ventricular systolic function is normal. Left Atrium: Left atrial size was normal in size. Right Atrium: Right atrial size was normal in size. Pericardium: Trivial pericardial effusion is present. The pericardial effusion is circumferential. Mitral Valve: The mitral valve is normal in structure. Trivial mitral valve regurgitation. No evidence of mitral valve stenosis. Tricuspid Valve: The tricuspid valve is normal in structure. Tricuspid valve regurgitation is not demonstrated. No evidence of tricuspid stenosis. Aortic Valve: The  aortic valve is normal in structure. Aortic valve regurgitation is not visualized. No aortic stenosis is present. Aortic valve mean gradient measures 2.0 mmHg. Aortic valve peak gradient measures 4.0 mmHg. Aortic valve area, by VTI measures 2.13 cm. Pulmonic Valve: The pulmonic valve was normal in structure. Pulmonic valve regurgitation is not visualized. No evidence of pulmonic stenosis. Aorta: There is borderline dilatation of the ascending aorta, measuring 37 mm. Venous: The inferior vena cava is normal in size with greater than 50% respiratory variability, suggesting right atrial pressure of 3 mmHg. IAS/Shunts: No atrial level shunt detected by color flow Doppler.  LEFT VENTRICLE PLAX 2D LVIDd:         4.50 cm     Diastology LVIDs:         2.70 cm     LV e' medial:    5.66 cm/s LV PW:         0.70 cm     LV E/e' medial:  10.9 LV IVS:        0.80 cm     LV e' lateral:   9.79 cm/s LVOT diam:     2.00 cm     LV E/e' lateral: 6.3 LV SV:         55 LV SV Index:   31 LVOT Area:     3.14 cm  LV Volumes (MOD) LV vol d, MOD A4C: 77.2 ml LV vol s, MOD A4C: 22.8 ml LV SV MOD A4C:     77.2 ml RIGHT VENTRICLE RV S prime:     15.30 cm/s TAPSE (M-mode): 2.5 cm LEFT ATRIUM             Index        RIGHT ATRIUM           Index LA Vol (A2C):   29.1 ml 16.32 ml/m  RA Area:     15.10 cm LA Vol (A4C):   27.6 ml 15.47 ml/m  RA Volume:   38.10 ml  21.36 ml/m LA Biplane Vol: 30.0 ml 16.82 ml/m  AORTIC VALVE AV Area (Vmax):    2.70 cm AV Area (Vmean):   2.66 cm AV Area (VTI):     2.13 cm AV  Vmax:           100.00 cm/s AV Vmean:          73.800 cm/s AV VTI:            0.258 m AV Peak Grad:      4.0 mmHg AV Mean Grad:      2.0 mmHg LVOT Vmax:         85.80 cm/s LVOT Vmean:        62.400 cm/s LVOT VTI:          0.175 m LVOT/AV VTI ratio: 0.68  AORTA Ao Root diam: 3.10 cm Ao Asc diam:  3.70 cm MITRAL VALVE MV Area (PHT): 3.10 cm    SHUNTS MV E velocity: 61.80 cm/s  Systemic VTI:  0.18 m MV A velocity: 86.30 cm/s  Systemic Diam:  2.00 cm MV E/A ratio:  0.72 Kardie Tobb DO Electronically signed by Thomasene Ripple DO Signature Date/Time: 02/11/2023/12:54:26 PM    Final    DG Chest 2 View  Result Date: 02/10/2023 CLINICAL DATA:  Chest and abdominal pain beginning today. EXAM: CHEST - 2 VIEW COMPARISON:  None Available. FINDINGS: Lungs are hypoinflated and otherwise clear. Cardiomediastinal silhouette is normal. Mild degenerative change of the spine. IMPRESSION: Hypoinflation without acute cardiopulmonary disease. Electronically Signed   By: Elberta Fortis M.D.   On: 02/10/2023 14:13    Microbiology: No results found for this or any previous visit (from the past 240 hour(s)).   Labs: Basic Metabolic Panel: Recent Labs  Lab 02/10/23 1316 02/11/23 0111  NA 134* 132*  K 5.0 4.1  CL 99 102  CO2 22 22  GLUCOSE 171* 174*  BUN 19 18  CREATININE 1.27* 1.05  CALCIUM 9.2 8.8*   Liver Function Tests: No results for input(s): "AST", "ALT", "ALKPHOS", "BILITOT", "PROT", "ALBUMIN" in the last 168 hours. No results for input(s): "LIPASE", "AMYLASE" in the last 168 hours. No results for input(s): "AMMONIA" in the last 168 hours. CBC: Recent Labs  Lab 02/10/23 1316 02/11/23 0111  WBC 7.4 5.8  HGB 11.9* 10.5*  HCT 36.8* 31.0*  MCV 94.6 89.9  PLT 289 260   Cardiac Enzymes: No results for input(s): "CKTOTAL", "CKMB", "CKMBINDEX", "TROPONINI" in the last 168 hours. BNP: BNP (last 3 results) No results for input(s): "BNP" in the last 8760 hours.  ProBNP (last 3 results) No results for input(s): "PROBNP" in the last 8760 hours.  CBG: Recent Labs  Lab 02/10/23 1318 02/10/23 1723 02/10/23 2112 02/11/23 0609 02/11/23 1124  GLUCAP 170* 157* 169* 120* 143*       Signed:  Rhetta Mura MD   [facility] 02/11/2023, 2:45 PM

## 2023-02-11 NOTE — Progress Notes (Signed)
At the recommendation of Dr. Cristal Deer, will arrange PET stress test, however if unable to schedule within a reasonable time frame (2-4 weeks), will switch to lexiscan myoview. Risk and benefit of the myoview explained to patient and family.

## 2023-02-11 NOTE — Progress Notes (Signed)
  Echocardiogram 2D Echocardiogram has been performed.  Rosaleigh Brazzel Wynn Banker 02/11/2023, 11:23 AM

## 2023-02-11 NOTE — Progress Notes (Signed)
Rounding Note    Patient Name: Jeffery Stewart Date of Encounter: 02/11/2023  [facility] Cardiologist: Jodelle Red, MD   Subjective   No acute events overnight. Awaiting echo, reviewed plan. No chest pain or shortness of breath.  Inpatient Medications    Scheduled Meds:  aspirin EC  81 mg Oral Daily   atorvastatin  40 mg Oral q1800   clonazePAM  1 mg Oral QHS   cycloSPORINE  1 drop Both Eyes BID   DULoxetine  60 mg Oral Daily   enoxaparin (LOVENOX) injection  40 mg Subcutaneous Q24H   famotidine  20 mg Oral BID   gabapentin  300 mg Oral Q0600   insulin aspart  0-15 Units Subcutaneous TID WC   insulin aspart  0-5 Units Subcutaneous QHS   sodium chloride flush  3 mL Intravenous Q12H   Continuous Infusions:  sodium chloride     PRN Meds: sodium chloride, acetaminophen, nitroGLYCERIN, ondansetron (ZOFRAN) IV, sodium chloride flush   Vital Signs    Vitals:   02/10/23 2330 02/11/23 0526 02/11/23 0530 02/11/23 0712  BP: 137/82  137/66 (!) 145/71  Pulse: 60  71 62  Resp:   18 20  Temp: 97.6 F (36.4 C) (!) 97.2 F (36.2 C)  97.7 F (36.5 C)  TempSrc: Oral Oral  Oral  SpO2: 97%  98% 93%  Weight:  69.3 kg    Height:        Intake/Output Summary (Last 24 hours) at 02/11/2023 0944 Last data filed at 02/11/2023 0816 Gross per 24 hour  Intake 240 ml  Output --  Net 240 ml      02/11/2023    5:26 AM 02/10/2023    7:05 PM 02/10/2023    5:46 PM  Last 3 Weights  Weight (lbs) 152 lb 12.8 oz 153 lb 10.6 oz 155 lb  Weight (kg) 69.31 kg 69.7 kg 70.308 kg      Telemetry    Sinus rhythm/sinus bradycardia - Personally Reviewed  ECG    No new - Personally Reviewed  Physical Exam   GEN: No acute distress.   Neck: No JVD Cardiac: RRR, no murmurs, rubs, or gallops.  Respiratory: Clear to auscultation bilaterally. GI: Soft, nontender, non-distended  MS: No edema; No deformity. Neuro:  Nonfocal  Psych: Normal affect   Labs    High Sensitivity  Troponin:   Recent Labs  Lab 02/10/23 1316 02/10/23 1502  TROPONINIHS 4 4     Chemistry Recent Labs  Lab 02/10/23 1316 02/11/23 0111  NA 134* 132*  K 5.0 4.1  CL 99 102  CO2 22 22  GLUCOSE 171* 174*  BUN 19 18  CREATININE 1.27* 1.05  CALCIUM 9.2 8.8*  GFRNONAA 56* >60  ANIONGAP 13 8    Lipids  Recent Labs  Lab 02/10/23 1502  CHOL 135  TRIG 224*  HDL 31*  LDLCALC 59  CHOLHDL 4.4    Hematology Recent Labs  Lab 02/10/23 1316 02/11/23 0111  WBC 7.4 5.8  RBC 3.89* 3.45*  HGB 11.9* 10.5*  HCT 36.8* 31.0*  MCV 94.6 89.9  MCH 30.6 30.4  MCHC 32.3 33.9  RDW 13.3 13.2  PLT 289 260   Thyroid  Recent Labs  Lab 02/10/23 1644  TSH 1.673    BNPNo results for input(s): "BNP", "PROBNP" in the last 168 hours.  DDimer No results for input(s): "DDIMER" in the last 168 hours.   Radiology    DG Chest 2 View  Result Date:  02/10/2023 CLINICAL DATA:  Chest and abdominal pain beginning today. EXAM: CHEST - 2 VIEW COMPARISON:  None Available. FINDINGS: Lungs are hypoinflated and otherwise clear. Cardiomediastinal silhouette is normal. Mild degenerative change of the spine. IMPRESSION: Hypoinflation without acute cardiopulmonary disease. Electronically Signed   By: Elberta Fortis M.D.   On: 02/10/2023 14:13    Cardiac Studies   Echo pending  Patient Profile     87 y.o. male with a hx of type II diabetes who is being seen for the evaluation of chest pain at the request of Dr. Ophelia Charter.   Assessment & Plan    Chest pain -hsTn 4, 4 -LDL 59, TG 224, HDL 31, Tchol 135 -echo pending -ECG with sinus bradycardia, no acute ischemic changes -has risk factor of diabetes -if echo significantly abnormal, would pursue cath for further evaluation, though would need to make sure Hgb stable -if echo largely normal, would pursue outpatient testing -given heart rate, would be a good candidate for coronary CT. Issue may be calcification. No chest CT available from prior, but can see  outline of vessels on chest x-ray, suggesting significant calcification. Outpatient nuclear stress test or cardiac PET would also be a good option -with history of GERD, cannot exclude GI etiology -has been started on aspirin and atorvastatin inpatient, was on pravastatin as outpatient   Anemia -Hgb was 12.4 2 years ago, then 11.9->10.5 today. MCV 94. Denies knowing any prior history of this. No known bleeding.   Chronic kidney disease, stage 3a -Cr 1.05 today, 1.30 two years ago   Type II diabetes -A1c 7.8  For questions or updates, please contact [facility] Please consult [email or web address] for contact info under        Signed, Jodelle Red, MD  02/11/2023, 9:44 AM

## 2023-02-12 LAB — LIPOPROTEIN A (LPA): Lipoprotein (a): 14.3 nmol/L (ref <75.0)

## 2023-02-13 ENCOUNTER — Telehealth: Payer: Self-pay | Admitting: Cardiology

## 2023-02-13 NOTE — Telephone Encounter (Signed)
The pt and wife were told by Dr Cristal Deer that they were going to be scheduled for a Stress Test for Monday 5/20? But never received a call verifying this.   I looked in the chart, there is no order for the stress test. It looks like Cardiac Stress Test consent was signed by Azalee Course, PA.  I just need to verify what test needs to be ordered. Also, they have a f/u appt on 5/30, so if the "test" is not completed by then, will they still need to come to the appt 5/30 or will they need to reschedule?  Informed that I would message the provider to find out the answers and get back in touch. Verbalized understanding

## 2023-02-13 NOTE — Telephone Encounter (Signed)
Patient's spouse is calling because she was told that the patient was scheduled to have stress test on 05/20. Patient's spouse is wanting to know if Dr. Cristal Deer was wanting the patient to have a stress test completed before 05/30. Please advise.

## 2023-02-14 ENCOUNTER — Other Ambulatory Visit: Payer: Self-pay

## 2023-02-14 DIAGNOSIS — R079 Chest pain, unspecified: Secondary | ICD-10-CM

## 2023-02-17 NOTE — Telephone Encounter (Signed)
Looks like terrah did place the order last week

## 2023-02-17 NOTE — Telephone Encounter (Signed)
We did recommend a PET stress test or lexiscan myoview. As mentioned in progress note during recent hospitalization on 5/14, "At the recommendation of Dr. Cristal Deer, will arrange PET stress test, however if unable to schedule within a reasonable time frame (2-4 weeks), will switch to lexiscan myoview. Risk and benefit of the myoview explained to patient and family."  At the time, we did not know the time frame of either test. Therefore no order was placed until the test can be scheduled. I did discuss benefit and risk and place consent since the benefit and risk for both PET stress and lexiscan stress is essentially the same. Gaspar Cola, can you check to see which one can be scheduled and place appropriate order.   I would recommend keep 5/30 appt as the stress test does not need to be completed prior to the appt

## 2023-02-20 DIAGNOSIS — G301 Alzheimer's disease with late onset: Secondary | ICD-10-CM | POA: Diagnosis not present

## 2023-02-20 DIAGNOSIS — B91 Sequelae of poliomyelitis: Secondary | ICD-10-CM | POA: Diagnosis not present

## 2023-02-27 ENCOUNTER — Ambulatory Visit (HOSPITAL_BASED_OUTPATIENT_CLINIC_OR_DEPARTMENT_OTHER): Payer: Medicare PPO | Admitting: Cardiology

## 2023-02-27 ENCOUNTER — Encounter (HOSPITAL_BASED_OUTPATIENT_CLINIC_OR_DEPARTMENT_OTHER): Payer: Self-pay | Admitting: Cardiology

## 2023-02-27 VITALS — BP 118/60 | HR 64 | Ht 66.0 in | Wt 152.8 lb

## 2023-02-27 DIAGNOSIS — E119 Type 2 diabetes mellitus without complications: Secondary | ICD-10-CM | POA: Diagnosis not present

## 2023-02-27 DIAGNOSIS — Z7984 Long term (current) use of oral hypoglycemic drugs: Secondary | ICD-10-CM

## 2023-02-27 DIAGNOSIS — I10 Essential (primary) hypertension: Secondary | ICD-10-CM | POA: Diagnosis not present

## 2023-02-27 DIAGNOSIS — R079 Chest pain, unspecified: Secondary | ICD-10-CM

## 2023-02-27 DIAGNOSIS — R058 Other specified cough: Secondary | ICD-10-CM

## 2023-02-27 DIAGNOSIS — E785 Hyperlipidemia, unspecified: Secondary | ICD-10-CM | POA: Diagnosis not present

## 2023-02-27 DIAGNOSIS — T464X5A Adverse effect of angiotensin-converting-enzyme inhibitors, initial encounter: Secondary | ICD-10-CM | POA: Diagnosis not present

## 2023-02-27 NOTE — Progress Notes (Signed)
Cardiology Office Note:    Date:  02/27/2023   ID:  Jeffery Stewart, DOB December 16, 1935, MRN [id number]  PCP:  Irena Reichmann, DO  Cardiologist:  Jodelle Red, MD  Referring MD: Irena Reichmann, DO   History of Present Illness:    Jeffery Stewart is a 87 y.o. male with a hx of type II diabetes who is seen at the request of Irena Reichmann, DO for the evaluation and management of chest pain. I initially met him on 02/10/2023 as a consult for evaluation of chest pain at the request of Dr. Ophelia Charter.   Today, he is accompanied by his wife. He has been well overall.   His wife reports an episode of chest pain on 5/17 after getting out of the shower. He rated his chest pain an 8/10. Denies any nausea. About 5 minutes after taking nitro, his symptoms fully resolved. A few days after this episode he was able to do yard work with no limitations.   He also complains of lightheadedness, usually after a bowel movement. When this occurs, his family member takes his blood pressure, reports a reading of 99/52. His symptoms tend to resolve after lying down and resting. Denies any syncopal episodes.   He has been compliant with 2.5 mg Lisinopril and reports he has been experiencing a cough.   He denies any palpitations, shortness of breath, or peripheral edema. No headaches, syncope, orthopnea, or PND.  Past Medical History:  Diagnosis Date   Dementia (HCC) 2012   Diabetes mellitus without complication (HCC)    GERD (gastroesophageal reflux disease)    Neuropathy    Post-polio syndrome    possible weakned intercostal muscles, fatigues easily    Past Surgical History:  Procedure Laterality Date   INSERTION PROSTATE RADIATION SEED  2007   MELANOMA EXCISION Left 2016   elbow    Current Medications: Current Outpatient Medications on File Prior to Visit  Medication Sig   Accu-Chek FastClix Lancets MISC USE 1  TO CHECK GLUCOSE ONCE DAILY   acetaminophen (TYLENOL) 500 MG tablet Take 500 mg by mouth daily  as needed for mild pain or moderate pain. Take as needed for back/neck pain.   aspirin EC 81 MG tablet Take 1 tablet (81 mg total) by mouth daily. Swallow whole.   atorvastatin (LIPITOR) 40 MG tablet Take 1 tablet (40 mg total) by mouth daily at 6 PM.   b complex vitamins capsule Take 1 capsule by mouth daily.   Blood Glucose Monitoring Suppl (GLUCOCOM BLOOD GLUCOSE MONITOR) DEVI 1 Device by Misc.(Non-Drug; Combo Route) route 4 (four) times daily.   clonazePAM (KLONOPIN) 1 MG tablet Take 1 mg by mouth at bedtime.   cycloSPORINE (RESTASIS) 0.05 % ophthalmic emulsion Place 1 drop into both eyes 2 (two) times daily.   DULoxetine (CYMBALTA) 60 MG capsule Take 60 mg by mouth daily.   famotidine (PEPCID) 20 MG tablet Take 20 mg by mouth 2 (two) times daily.   gabapentin (NEURONTIN) 300 MG capsule Take 300 mg by mouth daily at 6 (six) AM. Patients take 7 capsules a day.   glucose blood (ACCU-CHEK GUIDE) test strip 1 strip by Misc.(Non-Drug; Combo Route) route 4 (four) times daily.   glucose blood (ACCU-CHEK GUIDE) test strip 1 strip by Misc.(Non-Drug; Combo Route) route 4 (four) times daily.   lidocaine (LIDODERM) 5 % Place 1 patch onto the skin daily. Remove & Discard patch within 12 hours or as directed by MD   nitroGLYCERIN (NITROSTAT) 0.4 MG SL tablet Place 1  tablet (0.4 mg total) under the tongue every 5 (five) minutes x 3 doses as needed for chest pain.   sitaGLIPtin-metformin (JANUMET) 50-1000 MG tablet Take 1 tablet by mouth 2 (two) times daily with a meal.   No current facility-administered medications on file prior to visit.     Allergies:   Prednisone   Social History   Tobacco Use   Smoking status: Never   Smokeless tobacco: Never  Substance Use Topics   Alcohol use: Yes    Comment: occasional   Drug use: Never    Family History: family history includes CAD in his brother; CAD (age of onset: 31) in his daughter.  ROS:   Please see the history of present illness.  Additional  pertinent ROS: Constitutional: Negative for chills, fever, night sweats, unintentional weight loss  HENT: Negative for ear pain and hearing loss.   Eyes: Negative for loss of vision and eye pain.  Respiratory: Negative for cough, sputum, wheezing.   Cardiovascular: See HPI. Gastrointestinal: Negative for abdominal pain, melena, and hematochezia.  Genitourinary: Negative for dysuria and hematuria.  Musculoskeletal: Negative for falls and myalgias.  Skin: Negative for itching and rash.  Neurological: Negative for focal weakness, focal sensory changes and loss of consciousness.  Endo/Heme/Allergies: Does not bruise/bleed easily.     EKGs/Labs/Other Studies Reviewed:    The following studies were reviewed today:  Echo 02/11/2023:  IMPRESSIONS   1. Left ventricular ejection fraction, by estimation, is 55 to 60%. The  left ventricle has normal function. The left ventricle has no regional  wall motion abnormalities. Left ventricular diastolic parameters are  indeterminate.   2. Right ventricular systolic function is normal. The right ventricular  size is mildly enlarged.   3. The mitral valve is normal in structure. Trivial mitral valve  regurgitation. No evidence of mitral stenosis.   4. The aortic valve is normal in structure. Aortic valve regurgitation is  not visualized. No aortic stenosis is present.   5. There is borderline dilatation of the ascending aorta, measuring 37  mm.   6. The inferior vena cava is normal in size with greater than 50%  respiratory variability, suggesting right atrial pressure of 3 mmHg.   EKG:  EKG is personally reviewed.   02/27/2023: EKG was not ordered.  Recent Labs: 02/10/2023: TSH 1.673 02/11/2023: BUN 18; Creatinine, Ser 1.05; Hemoglobin 10.5; Platelets 260; Potassium 4.1; Sodium 132  Recent Lipid Panel    Component Value Date/Time   CHOL 135 02/10/2023 1502   TRIG 224 (H) 02/10/2023 1502   HDL 31 (L) 02/10/2023 1502   CHOLHDL 4.4 02/10/2023  1502   VLDL 45 (H) 02/10/2023 1502   LDLCALC 59 02/10/2023 1502    Physical Exam:    VS:  BP 118/60   Pulse 64   Ht 5\' 6"  (1.676 m)   Wt 152 lb 12.8 oz (69.3 kg)   SpO2 97%   BMI 24.66 kg/m     Wt Readings from Last 3 Encounters:  02/27/23 152 lb 12.8 oz (69.3 kg)  02/11/23 152 lb 12.8 oz (69.3 kg)    GEN: Well nourished, well developed in no acute distress HEENT: Normal, moist mucous membranes NECK: No JVD CARDIAC: regular rhythm, normal S1 and S2, no rubs or gallops. No murmur. VASCULAR: Radial and DP pulses 2+ bilaterally. No carotid bruits RESPIRATORY:  Clear to auscultation without rales, wheezing or rhonchi  ABDOMEN: Soft, non-tender, non-distended MUSCULOSKELETAL:  Ambulates independently SKIN: Warm and dry, no edema NEUROLOGIC:  Alert  and oriented x 3. No focal neuro deficits noted. PSYCHIATRIC:  Normal affect    ASSESSMENT:    1. Chest pain of uncertain etiology   2. Diabetes mellitus without complication (HCC)   3. Dyslipidemia   4. Essential hypertension   5. Cough due to ACE inhibitor    PLAN:    Chest pain Risk factors of diabetes, dyslipidemia -he has been ordered for cardiac PET, which we discussed during his hospitalization -reviewed echo results -reviewed red flag warning signs that need immediate medical attention  Hypertension Cough, likely due to ace inhibitor -stop lisinopril. Monitor BP. If rises >130/80, would start ARB  Cardiac risk counseling and prevention recommendations: -recommend heart healthy/Mediterranean diet, with whole grains, fruits, vegetable, fish, lean meats, nuts, and olive oil. Limit salt. -recommend moderate walking, 3-5 times/week for 30-50 minutes each session. Aim for at least 150 minutes.week. Goal should be pace of 3 miles/hours, or walking 1.5 miles in 30 minutes -recommend avoidance of tobacco products. Avoid excess alcohol.  Plan for follow up: 6 mos  Jodelle Red, MD, PhD, [facility] [place] [facility]  [facility]  [facility]  [place]  [facility] at [facility] at [facility] [address] [place], [place] [address] [phone]   I,Rachel Rivera,acting as a scribe for Jodelle Red, MD.,have documented all relevant documentation on the behalf of Jodelle Red, MD,as directed by  Jodelle Red, MD while in the presence of Jodelle Red, MD.  I, Jodelle Red, MD, have reviewed all documentation for this visit. The documentation on 04/05/23 for the exam, diagnosis, procedures, and orders are all accurate and complete.   Signed, Jodelle Red, MD PhD 02/27/2023     [facility]

## 2023-02-27 NOTE — Patient Instructions (Addendum)
Medication Instructions:   STOP Lisinopril-due to cough. If blood pressure goes up, could consider an ARB like losartan instead. For now, would just remain off of it.  *If you need a refill on your cardiac medications before your next appointment, please call your pharmacy*  Lab Work: NONE ordered at this time of appointment   If you have labs (blood work) drawn today and your tests are completely normal, you will receive your results only by: MyChart Message (if you have MyChart) OR A paper copy in the mail If you have any lab test that is abnormal or we need to change your treatment, we will call you to review the results.  Testing/Procedures: NONE ordered at this time of appointment   Follow-Up: At [facility], you and your health needs are our priority.  As part of our continuing mission to provide you with exceptional heart care, we have created designated Provider Care Teams.  These Care Teams include your primary Cardiologist (physician) and Advanced Practice Providers (APPs -  Physician Assistants and Nurse Practitioners) who all work together to provide you with the care you need, when you need it.   Your next appointment:   6 month(s)  Provider:   Jodelle Red, MD    Other Instructions

## 2023-03-04 DIAGNOSIS — L570 Actinic keratosis: Secondary | ICD-10-CM | POA: Diagnosis not present

## 2023-03-04 DIAGNOSIS — L821 Other seborrheic keratosis: Secondary | ICD-10-CM | POA: Diagnosis not present

## 2023-03-04 DIAGNOSIS — D229 Melanocytic nevi, unspecified: Secondary | ICD-10-CM | POA: Diagnosis not present

## 2023-03-04 DIAGNOSIS — L814 Other melanin hyperpigmentation: Secondary | ICD-10-CM | POA: Diagnosis not present

## 2023-03-04 DIAGNOSIS — L578 Other skin changes due to chronic exposure to nonionizing radiation: Secondary | ICD-10-CM | POA: Diagnosis not present

## 2023-03-04 DIAGNOSIS — D1801 Hemangioma of skin and subcutaneous tissue: Secondary | ICD-10-CM | POA: Diagnosis not present

## 2023-03-04 DIAGNOSIS — W57XXXA Bitten or stung by nonvenomous insect and other nonvenomous arthropods, initial encounter: Secondary | ICD-10-CM | POA: Diagnosis not present

## 2023-04-05 ENCOUNTER — Encounter (HOSPITAL_BASED_OUTPATIENT_CLINIC_OR_DEPARTMENT_OTHER): Payer: Self-pay | Admitting: Cardiology

## 2023-04-18 ENCOUNTER — Telehealth (HOSPITAL_COMMUNITY): Payer: Self-pay | Admitting: *Deleted

## 2023-04-18 NOTE — Telephone Encounter (Signed)
Attempted to call patient regarding upcoming cardiac PET appointment. Unable to leave a voicemail.  Larey Brick RN Navigator Cardiac Imaging [facility] [phone] Office [phone] Cell

## 2023-04-21 ENCOUNTER — Telehealth (HOSPITAL_COMMUNITY): Payer: Self-pay | Admitting: *Deleted

## 2023-04-21 NOTE — Telephone Encounter (Signed)
Reaching out to patient's wife to offer assistance regarding upcoming cardiac imaging study. Patient's wife states that patient has Alzheimer. She verbalizes understanding of appt date/time, parking situation and where to check in, pre-test NPO status, and verified current allergies; name and call back number provided for further questions should they arise  Larey Brick RN Navigator Cardiac Imaging [facility] [phone] office [phone] cell  Patient's wife understands that patient is to hold caffeine 12 hours prior to his cardiac PET scan.

## 2023-04-22 ENCOUNTER — Encounter (HOSPITAL_COMMUNITY)
Admission: RE | Admit: 2023-04-22 | Discharge: 2023-04-22 | Disposition: A | Discharge status: Home / Self Care | Payer: Medicare PPO | Source: Ambulatory Visit | Attending: Physician Assistant | Admitting: Physician Assistant

## 2023-04-22 DIAGNOSIS — R079 Chest pain, unspecified: Secondary | ICD-10-CM | POA: Insufficient documentation

## 2023-04-22 MED ORDER — RUBIDIUM RB82 GENERATOR (RUBYFILL)
17.8400 | PACK | Freq: Once | INTRAVENOUS | Status: AC
  Administered 2023-04-22: 17.84 via INTRAVENOUS

## 2023-04-22 MED ORDER — RUBIDIUM RB82 GENERATOR (RUBYFILL)
18.0000 | PACK | Freq: Once | INTRAVENOUS | Status: AC
  Administered 2023-04-22: 17.86 via INTRAVENOUS

## 2023-04-22 MED ORDER — REGADENOSON 0.4 MG/5ML IV SOLN
INTRAVENOUS | Status: AC
  Filled 2023-04-22: qty 5

## 2023-04-22 MED ORDER — REGADENOSON 0.4 MG/5ML IV SOLN
0.4000 mg | Freq: Once | INTRAVENOUS | Status: AC
  Administered 2023-04-22: 0.4 mg via INTRAVENOUS

## 2023-04-22 NOTE — Progress Notes (Signed)
Patient presents for a cardiac PET stress test and tolerated procedure without incident. Patient maintained acceptable vital signs throughout the test and was offered caffeine after test.   Patient denies symptoms.

## 2023-04-23 LAB — NM PET CT CARDIAC PERFUSION MULTI W/ABSOLUTE BLOODFLOW
LV dias vol: 69 mL (ref 62–150)
LV sys vol: 26 mL
MBFR: 1.88
Nuc Rest EF: 54 %
Nuc Stress EF: 62 %
Peak HR: 67 {beats}/min
Rest HR: 57 {beats}/min
Rest MBF: 0.91 ml/g/min
Rest Nuclear Isotope Dose: 17.8 mCi
ST Depression (mm): 0 mm
Stress MBF: 1.71 ml/g/min
Stress Nuclear Isotope Dose: 17.9 mCi
TID: 1.03

## 2023-04-23 NOTE — Progress Notes (Signed)
Reassuring stress test without significant reversible blockage or scars.

## 2023-06-19 DIAGNOSIS — M65352 Trigger finger, left little finger: Secondary | ICD-10-CM | POA: Diagnosis not present

## 2023-06-19 DIAGNOSIS — M65331 Trigger finger, right middle finger: Secondary | ICD-10-CM | POA: Diagnosis not present

## 2023-06-19 DIAGNOSIS — M65322 Trigger finger, left index finger: Secondary | ICD-10-CM | POA: Diagnosis not present

## 2023-06-19 DIAGNOSIS — M65341 Trigger finger, right ring finger: Secondary | ICD-10-CM | POA: Diagnosis not present

## 2023-06-19 DIAGNOSIS — M65351 Trigger finger, right little finger: Secondary | ICD-10-CM | POA: Diagnosis not present

## 2023-06-19 DIAGNOSIS — M79645 Pain in left finger(s): Secondary | ICD-10-CM | POA: Diagnosis not present

## 2023-07-30 DIAGNOSIS — B91 Sequelae of poliomyelitis: Secondary | ICD-10-CM | POA: Diagnosis not present

## 2023-07-30 DIAGNOSIS — G301 Alzheimer's disease with late onset: Secondary | ICD-10-CM | POA: Diagnosis not present

## 2023-08-01 DIAGNOSIS — M65332 Trigger finger, left middle finger: Secondary | ICD-10-CM | POA: Diagnosis not present

## 2023-08-01 DIAGNOSIS — E119 Type 2 diabetes mellitus without complications: Secondary | ICD-10-CM | POA: Diagnosis not present

## 2023-08-01 DIAGNOSIS — M65352 Trigger finger, left little finger: Secondary | ICD-10-CM | POA: Diagnosis not present

## 2023-08-01 DIAGNOSIS — M65342 Trigger finger, left ring finger: Secondary | ICD-10-CM | POA: Diagnosis not present

## 2023-08-01 DIAGNOSIS — G309 Alzheimer's disease, unspecified: Secondary | ICD-10-CM | POA: Diagnosis not present

## 2023-08-01 DIAGNOSIS — M65322 Trigger finger, left index finger: Secondary | ICD-10-CM | POA: Diagnosis not present

## 2023-08-04 DIAGNOSIS — E1142 Type 2 diabetes mellitus with diabetic polyneuropathy: Secondary | ICD-10-CM | POA: Diagnosis not present

## 2023-08-04 DIAGNOSIS — D649 Anemia, unspecified: Secondary | ICD-10-CM | POA: Diagnosis not present

## 2023-08-04 DIAGNOSIS — N1831 Chronic kidney disease, stage 3a: Secondary | ICD-10-CM | POA: Diagnosis not present

## 2023-08-04 DIAGNOSIS — E1122 Type 2 diabetes mellitus with diabetic chronic kidney disease: Secondary | ICD-10-CM | POA: Diagnosis not present

## 2023-08-04 DIAGNOSIS — K219 Gastro-esophageal reflux disease without esophagitis: Secondary | ICD-10-CM | POA: Diagnosis not present

## 2023-08-11 DIAGNOSIS — Z79899 Other long term (current) drug therapy: Secondary | ICD-10-CM | POA: Diagnosis not present

## 2023-08-11 DIAGNOSIS — N1831 Chronic kidney disease, stage 3a: Secondary | ICD-10-CM | POA: Diagnosis not present

## 2023-08-11 DIAGNOSIS — E1142 Type 2 diabetes mellitus with diabetic polyneuropathy: Secondary | ICD-10-CM | POA: Diagnosis not present

## 2023-08-11 DIAGNOSIS — G14 Postpolio syndrome: Secondary | ICD-10-CM | POA: Diagnosis not present

## 2023-08-11 DIAGNOSIS — G309 Alzheimer's disease, unspecified: Secondary | ICD-10-CM | POA: Diagnosis not present

## 2023-08-11 DIAGNOSIS — G4761 Periodic limb movement disorder: Secondary | ICD-10-CM | POA: Diagnosis not present

## 2023-08-11 DIAGNOSIS — R634 Abnormal weight loss: Secondary | ICD-10-CM | POA: Diagnosis not present

## 2023-08-11 DIAGNOSIS — E1122 Type 2 diabetes mellitus with diabetic chronic kidney disease: Secondary | ICD-10-CM | POA: Diagnosis not present

## 2023-08-21 ENCOUNTER — Ambulatory Visit (HOSPITAL_BASED_OUTPATIENT_CLINIC_OR_DEPARTMENT_OTHER): Payer: Medicare PPO | Admitting: Cardiology

## 2023-08-22 DIAGNOSIS — R051 Acute cough: Secondary | ICD-10-CM | POA: Diagnosis not present

## 2023-08-22 DIAGNOSIS — M5441 Lumbago with sciatica, right side: Secondary | ICD-10-CM | POA: Diagnosis not present

## 2023-08-22 DIAGNOSIS — J189 Pneumonia, unspecified organism: Secondary | ICD-10-CM | POA: Diagnosis not present

## 2023-09-09 DIAGNOSIS — M47816 Spondylosis without myelopathy or radiculopathy, lumbar region: Secondary | ICD-10-CM | POA: Diagnosis not present

## 2023-09-09 DIAGNOSIS — M25552 Pain in left hip: Secondary | ICD-10-CM | POA: Diagnosis not present

## 2023-09-25 DIAGNOSIS — M545 Low back pain, unspecified: Secondary | ICD-10-CM | POA: Diagnosis not present

## 2023-10-07 DIAGNOSIS — M545 Low back pain, unspecified: Secondary | ICD-10-CM | POA: Diagnosis not present

## 2023-10-20 DIAGNOSIS — M5416 Radiculopathy, lumbar region: Secondary | ICD-10-CM | POA: Diagnosis not present

## 2023-11-12 DIAGNOSIS — M5416 Radiculopathy, lumbar region: Secondary | ICD-10-CM | POA: Diagnosis not present

## 2023-12-03 DIAGNOSIS — G301 Alzheimer's disease with late onset: Secondary | ICD-10-CM | POA: Diagnosis not present

## 2023-12-03 DIAGNOSIS — G4701 Insomnia due to medical condition: Secondary | ICD-10-CM | POA: Diagnosis not present

## 2023-12-24 DIAGNOSIS — M5416 Radiculopathy, lumbar region: Secondary | ICD-10-CM | POA: Diagnosis not present

## 2024-01-08 DIAGNOSIS — M5416 Radiculopathy, lumbar region: Secondary | ICD-10-CM | POA: Diagnosis not present

## 2024-01-08 DIAGNOSIS — M47896 Other spondylosis, lumbar region: Secondary | ICD-10-CM | POA: Diagnosis not present

## 2024-01-19 DIAGNOSIS — E119 Type 2 diabetes mellitus without complications: Secondary | ICD-10-CM | POA: Diagnosis not present

## 2024-01-19 DIAGNOSIS — H401131 Primary open-angle glaucoma, bilateral, mild stage: Secondary | ICD-10-CM | POA: Diagnosis not present

## 2024-01-19 DIAGNOSIS — H04123 Dry eye syndrome of bilateral lacrimal glands: Secondary | ICD-10-CM | POA: Diagnosis not present

## 2024-01-28 DIAGNOSIS — M5416 Radiculopathy, lumbar region: Secondary | ICD-10-CM | POA: Diagnosis not present

## 2024-01-28 DIAGNOSIS — M47896 Other spondylosis, lumbar region: Secondary | ICD-10-CM | POA: Diagnosis not present

## 2024-02-10 DIAGNOSIS — H401131 Primary open-angle glaucoma, bilateral, mild stage: Secondary | ICD-10-CM | POA: Diagnosis not present

## 2024-03-03 ENCOUNTER — Ambulatory Visit: Admitting: Nurse Practitioner

## 2024-03-03 ENCOUNTER — Encounter: Payer: Self-pay | Admitting: Nurse Practitioner

## 2024-03-03 ENCOUNTER — Ambulatory Visit: Payer: Self-pay | Admitting: Nurse Practitioner

## 2024-03-03 VITALS — BP 112/70 | HR 57 | Temp 97.3°F | Ht 66.0 in | Wt 150.4 lb

## 2024-03-03 DIAGNOSIS — E119 Type 2 diabetes mellitus without complications: Secondary | ICD-10-CM | POA: Diagnosis not present

## 2024-03-03 DIAGNOSIS — G8929 Other chronic pain: Secondary | ICD-10-CM | POA: Insufficient documentation

## 2024-03-03 DIAGNOSIS — G301 Alzheimer's disease with late onset: Secondary | ICD-10-CM

## 2024-03-03 DIAGNOSIS — Z8546 Personal history of malignant neoplasm of prostate: Secondary | ICD-10-CM | POA: Diagnosis not present

## 2024-03-03 DIAGNOSIS — M545 Low back pain, unspecified: Secondary | ICD-10-CM | POA: Diagnosis not present

## 2024-03-03 DIAGNOSIS — G14 Postpolio syndrome: Secondary | ICD-10-CM

## 2024-03-03 DIAGNOSIS — Z7984 Long term (current) use of oral hypoglycemic drugs: Secondary | ICD-10-CM | POA: Diagnosis not present

## 2024-03-03 DIAGNOSIS — E785 Hyperlipidemia, unspecified: Secondary | ICD-10-CM | POA: Diagnosis not present

## 2024-03-03 DIAGNOSIS — M47896 Other spondylosis, lumbar region: Secondary | ICD-10-CM | POA: Diagnosis not present

## 2024-03-03 DIAGNOSIS — H409 Unspecified glaucoma: Secondary | ICD-10-CM | POA: Diagnosis not present

## 2024-03-03 DIAGNOSIS — F02C2 Dementia in other diseases classified elsewhere, severe, with psychotic disturbance: Secondary | ICD-10-CM

## 2024-03-03 DIAGNOSIS — M5416 Radiculopathy, lumbar region: Secondary | ICD-10-CM | POA: Diagnosis not present

## 2024-03-03 LAB — POCT URINALYSIS DIPSTICK
Bilirubin, UA: NEGATIVE
Blood, UA: NEGATIVE
Glucose, UA: NEGATIVE
Ketones, UA: NEGATIVE
Leukocytes, UA: NEGATIVE
Nitrite, UA: NEGATIVE
Protein, UA: POSITIVE — AB
Spec Grav, UA: 1.02 (ref 1.010–1.025)
Urobilinogen, UA: 0.2 U/dL
pH, UA: 6 (ref 5.0–8.0)

## 2024-03-03 LAB — COMPREHENSIVE METABOLIC PANEL WITH GFR
ALT: 10 U/L (ref 0–53)
AST: 14 U/L (ref 0–37)
Albumin: 4.6 g/dL (ref 3.5–5.2)
Alkaline Phosphatase: 48 U/L (ref 39–117)
BUN: 12 mg/dL (ref 6–23)
CO2: 29 meq/L (ref 19–32)
Calcium: 9.4 mg/dL (ref 8.4–10.5)
Chloride: 101 meq/L (ref 96–112)
Creatinine, Ser: 1.43 mg/dL (ref 0.40–1.50)
GFR: 44.57 mL/min — ABNORMAL LOW (ref >60.00)
Glucose, Bld: 173 mg/dL — ABNORMAL HIGH (ref 70–99)
Potassium: 4.4 meq/L (ref 3.5–5.1)
Sodium: 137 meq/L (ref 135–145)
Total Bilirubin: 0.4 mg/dL (ref 0.2–1.2)
Total Protein: 7.2 g/dL (ref 6.0–8.3)

## 2024-03-03 LAB — LIPID PANEL
Cholesterol: 204 mg/dL — ABNORMAL HIGH (ref 0–200)
HDL: 38.1 mg/dL — ABNORMAL LOW (ref >39.00)
LDL Cholesterol: 112 mg/dL — ABNORMAL HIGH (ref 0–99)
NonHDL: 165.93
Total CHOL/HDL Ratio: 5
Triglycerides: 269 mg/dL — ABNORMAL HIGH (ref 0.0–149.0)
VLDL: 53.8 mg/dL — ABNORMAL HIGH (ref 0.0–40.0)

## 2024-03-03 LAB — CBC WITH DIFFERENTIAL/PLATELET
Basophils Absolute: 0 10*3/uL (ref 0.0–0.1)
Basophils Relative: 0.5 % (ref 0.0–3.0)
Eosinophils Absolute: 0.1 10*3/uL (ref 0.0–0.7)
Eosinophils Relative: 1.8 % (ref 0.0–5.0)
HCT: 40.5 % (ref 39.0–52.0)
Hemoglobin: 13.2 g/dL (ref 13.0–17.0)
Lymphocytes Relative: 27.3 % (ref 12.0–46.0)
Lymphs Abs: 1.7 10*3/uL (ref 0.7–4.0)
MCHC: 32.7 g/dL (ref 30.0–36.0)
MCV: 91.5 fl (ref 78.0–100.0)
Monocytes Absolute: 0.4 10*3/uL (ref 0.1–1.0)
Monocytes Relative: 6.4 % (ref 3.0–12.0)
Neutro Abs: 3.9 10*3/uL (ref 1.4–7.7)
Neutrophils Relative %: 64 % (ref 43.0–77.0)
Platelets: 322 10*3/uL (ref 150.0–400.0)
RBC: 4.42 Mil/uL (ref 4.22–5.81)
RDW: 14.4 % (ref 11.5–15.5)
WBC: 6 10*3/uL (ref 4.0–10.5)

## 2024-03-03 LAB — HEMOGLOBIN A1C: Hgb A1c MFr Bld: 8 % — ABNORMAL HIGH (ref 4.6–6.5)

## 2024-03-03 MED ORDER — REXULTI 0.5 MG PO TABS: 0.5000 mg | ORAL_TABLET | Freq: Every day | ORAL | 0 refills | Status: DC

## 2024-03-03 MED ORDER — ACCU-CHEK GUIDE TEST VI STRP: ORAL_STRIP | 12 refills | Status: DC

## 2024-03-03 MED ORDER — SITAGLIPTIN PHOSPHATE 100 MG PO TABS: 100.0000 mg | ORAL_TABLET | Freq: Every day | ORAL | 3 refills | Status: AC

## 2024-03-03 MED ORDER — METFORMIN HCL 1000 MG PO TABS: 1000.0000 mg | ORAL_TABLET | Freq: Two times a day (BID) | ORAL | 3 refills | Status: AC

## 2024-03-03 NOTE — Assessment & Plan Note (Signed)
 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 Continue physical therapy and following with specialist.

## 2024-03-03 NOTE — Assessment & Plan Note (Signed)
 Managed with Hydrus stents and laser treatment. No current eye drops. Continue follow-up with ophthalmologist.

## 2024-03-03 NOTE — Assessment & Plan Note (Signed)
 Chronic, stable. Not currently taking statin as it caused muscle cramping, pain and confusion.

## 2024-03-03 NOTE — Assessment & Plan Note (Signed)
 Treated with radioactive seeds. No current urologist follow-up needed.

## 2024-03-03 NOTE — Progress Notes (Signed)
 New Patient Visit  BP 112/70 (BP Location: Left Arm, Cuff Size: Normal)   Pulse (!) 57   Temp (!) 97.3 F (36.3 C) (Temporal)   Ht 5\' 6"  (1.676 m)   Wt 150 lb 6.4 oz (68.2 kg)   SpO2 97%   BMI 24.28 kg/m    Subjective:    Patient ID: Jeffery Stewart, Stewart    DOB: [birth year], 88 y.o.   MRN: [id number]  CC: Chief Complaint  Patient presents with   Establish Care    NP. Est. Care, concerns none but need strips for glucose monitor     HPI: Jeffery Stewart is a 88 y.o. Stewart presents for new patient visit to establish care.  Introduced to Publishing rights manager role and practice setting.  All questions answered.  Discussed provider/patient relationship and expectations.  Discussed the use of AI scribe software for clinical note transcription with the patient, who gave verbal consent to proceed.  History of Present Illness   Jeffery Stewart is an 88 year old Stewart with Alzheimer's disease who presents with increased agitation and insomnia. He is accompanied by his wife, who is also his primary caregiver.  He experiences increased agitation and insomnia, especially at night, with attempts to leave the house and rearrange items. These symptoms are intermittent, with some nights of normal sleep. On May 29, he had incoherent speech lasting all day, followed by improved coherence but persistent delusional conversations.  He has visual hallucinations, paranoia, and a history of delusions. Seroquel was previously trialed but discontinued due to headaches and increased agitation. He currently takes Belsomra 5 mg for insomnia, which has been beneficial.  He has experienced falls, including a recent fall in the yard two weeks ago, without significant injuries. He uses a cane and has access to a walker.  He manages diabetes with Janumet twice daily, with blood sugars ranging from 120 to 168 mg/dL. He has glaucoma, treated with cataract surgery and laser treatments. 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 He also has arthritis in his lower spine, receiving steroid injections and physical therapy. He had a full cardiac workup for chest pain attributed to acid reflux. He had pneumonia several months ago and is up to date on vaccinations. No fever, bowel changes, or increased urination. Blood pressure is stable at home.        03/03/2024    1:21 PM  Depression screen PHQ 2/9  Decreased Interest 2  Down, Depressed, Hopeless 1  PHQ - 2 Score 3  Altered sleeping 2  Tired, decreased energy 3  Change in appetite 2  Feeling bad or failure about yourself  2  Trouble concentrating 3  Moving slowly or fidgety/restless 3  Suicidal thoughts 1  PHQ-9 Score 19  Difficult doing work/chores Extremely dIfficult      03/03/2024    1:21 PM  GAD 7 : Generalized Anxiety Score  Nervous, Anxious, on Edge 3  Control/stop worrying 3  Worry too much - different things 3  Trouble relaxing 2  Restless 2  Easily annoyed or irritable 2  Afraid - awful might happen 2  Total GAD 7 Score 17  Anxiety Difficulty Extremely difficult    Past Medical History:  Diagnosis Date   Arthritis    Dementia (HCC) 2012   Diabetes mellitus without complication (HCC)    GERD (gastroesophageal reflux disease)    Glaucoma    History of prostate cancer    Neuropathy  Post-polio syndrome    possible weakned intercostal muscles, fatigues easily    Past Surgical History:  Procedure Laterality Date   EYE SURGERY     bilateral stent for glaucoma   INSERTION PROSTATE RADIATION SEED  2007   MELANOMA EXCISION Left 2016   elbow    Family History  Problem Relation Age of Onset   CAD Brother    CAD Daughter 13     Social History   Tobacco Use   Smoking status: Never   Smokeless tobacco: Never  Substance Use Topics   Alcohol use: Yes    Comment: occasional   Drug use: Never    Current Outpatient Medications on File Prior to Visit  Medication Sig  Dispense Refill   Accu-Chek FastClix Lancets MISC USE 1  TO CHECK GLUCOSE ONCE DAILY     acetaminophen  (TYLENOL ) 500 MG tablet Take 500 mg by mouth daily as needed for mild pain or moderate pain. Take as needed for back/neck pain.     Blood Glucose Monitoring Suppl (GLUCOCOM BLOOD GLUCOSE MONITOR) DEVI 1 Device by Misc.(Non-Drug; Combo Route) route 4 (four) times daily.     clonazePAM  (KLONOPIN ) 1 MG tablet Take 1 mg by mouth at bedtime.     cycloSPORINE  (RESTASIS ) 0.05 % ophthalmic emulsion Place 1 drop into both eyes 2 (two) times daily.     DULoxetine  (CYMBALTA ) 60 MG capsule Take 60 mg by mouth daily.     famotidine  (PEPCID ) 20 MG tablet Take 20 mg by mouth 2 (two) times daily.     gabapentin  (NEURONTIN ) 300 MG capsule Take 300 mg by mouth daily at 6 (six) AM. Patients take 7 capsules a day.     lidocaine (LIDODERM) 5 % Place 1 patch onto the skin daily. Remove & Discard patch within 12 hours or as directed by MD     nitroGLYCERIN  (NITROSTAT ) 0.4 MG SL tablet Place 1 tablet (0.4 mg total) under the tongue every 5 (five) minutes x 3 doses as needed for chest pain. 60 tablet 12   Suvorexant (BELSOMRA) 5 MG TABS Take by mouth at bedtime.     b complex vitamins capsule Take 1 capsule by mouth daily. (Patient not taking: Reported on 03/03/2024)     No current facility-administered medications on file prior to visit.     Review of Systems  Constitutional:  Positive for fatigue. Negative for fever.  HENT: Negative.    Eyes: Negative.   Respiratory: Negative.    Cardiovascular: Negative.   Gastrointestinal: Negative.   Endocrine: Negative.   Genitourinary: Negative.   Musculoskeletal:  Positive for back pain.  Skin: Negative.   Neurological: Negative.   Psychiatric/Behavioral:  Positive for agitation, confusion and sleep disturbance.         Objective:     BP 112/70 (BP Location: Left Arm, Cuff Size: Normal)   Pulse (!) 57   Temp (!) 97.3 F (36.3 C) (Temporal)   Ht 5\' 6"  (1.676 m)    Wt 150 lb 6.4 oz (68.2 kg)   SpO2 97%   BMI 24.28 kg/m   Wt Readings from Last 3 Encounters:  03/03/24 150 lb 6.4 oz (68.2 kg)  02/27/23 152 lb 12.8 oz (69.3 kg)  02/11/23 152 lb 12.8 oz (69.3 kg)    BP Readings from Last 3 Encounters:  03/03/24 112/70  04/22/23 (!) 124/56  02/27/23 118/60    Physical Exam Vitals and nursing note reviewed.  Constitutional:      General: He is not in  acute distress.    Appearance: Normal appearance.  HENT:     Head: Normocephalic and atraumatic.  Eyes:     Conjunctiva/sclera: Conjunctivae normal.  Cardiovascular:     Rate and Rhythm: Normal rate and regular rhythm.     Pulses: Normal pulses.     Heart sounds: Normal heart sounds.  Pulmonary:     Effort: Pulmonary effort is normal.     Breath sounds: Normal breath sounds.  Abdominal:     Palpations: Abdomen is soft.     Tenderness: There is no abdominal tenderness.  Musculoskeletal:        General: Normal range of motion.     Cervical back: Normal range of motion and neck supple. No tenderness.     Right lower leg: No edema.     Left lower leg: No edema.  Lymphadenopathy:     Cervical: No cervical adenopathy.  Skin:    General: Skin is warm and dry.  Neurological:     Mental Status: He is alert. Mental status is at baseline. He is disoriented.     Motor: Weakness present.  Psychiatric:        Mood and Affect: Mood normal.        Behavior: Behavior normal.        Thought Content: Thought content normal.        Judgment: Judgment normal.      Assessment & Plan:   Problem List Items Addressed This Visit       Endocrine   Diabetes mellitus without complication (HCC) - Primary   Chronic, stable. Long-standing diabetes with A1c of 7.6% 6 months ago. Janumet is difficult to administer. Discontinue Janumet. Start Januvia 100 mg once daily and Metformin 1000 mg twice daily. Check A1c, CMP, CBC, urine microalbumin today. He follows regularly with ophthalmology.       Relevant  Medications   metFORMIN (GLUCOPHAGE) 1000 MG tablet   sitaGLIPtin (JANUVIA) 100 MG tablet   Other Relevant Orders   CBC with Differential/Platelet (Completed)   Comprehensive metabolic panel with GFR (Completed)   Hemoglobin A1c (Completed)   Microalbumin / creatinine urine ratio     Nervous and Auditory   Dementia (HCC)   Progressive cognitive decline with exacerbated symptoms. U/A negative. Previous Seroquel trial was ineffective. Considering Rexulti for behavioral management with informed consent. Start Rexulti 0.5 mg once daily at night. Monitor for side effects and effectiveness. Continue Belsomra 5 mg for insomnia, monitoring for fatigue with Rexulti. Consider reducing Belsomra if Rexulti causes sleepiness. He is also following with neurology and has an appointment in December. Recent notes from neurology reviewed. Follow-up in 4 weeks.       Relevant Medications   Suvorexant (BELSOMRA) 5 MG TABS   Brexpiprazole (REXULTI) 0.5 MG TABS   Other Relevant Orders   POCT urinalysis dipstick (Completed)   Post-polio syndrome   Chronic condition with increased symptoms affecting swallowing and vasovagal responses. Continue making sure food is well chewed and follow-up with any concerns.         Other   Dyslipidemia (Chronic)   Chronic, stable. Not currently taking statin as it caused muscle cramping, pain and confusion.       Relevant Orders   CBC with Differential/Platelet (Completed)   Comprehensive metabolic panel with GFR (Completed)   Lipid panel (Completed)   Long term current use of oral hypoglycemic drug   Continue januvia 100mg  daily and metformin 1,000mg  BID.       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 Continue physical therapy and following with specialist.       Glaucoma of both eyes   Managed with Hydrus stents and laser treatment. No current eye drops. Continue follow-up with  ophthalmologist.      History of prostate cancer   Treated with radioactive seeds. No current urologist follow-up needed.        Follow up plan: Return in about 4 weeks (around 03/31/2024) for dementia.  K[place]ak Rollyson A Talen Poser

## 2024-03-03 NOTE — Assessment & Plan Note (Signed)
 Chronic condition with increased symptoms affecting swallowing and vasovagal responses. Continue making sure food is well chewed and follow-up with any concerns.

## 2024-03-03 NOTE — Assessment & Plan Note (Signed)
 Progressive cognitive decline with exacerbated symptoms. U/A negative. Previous Seroquel trial was ineffective. Considering Rexulti for behavioral management with informed consent. Start Rexulti 0.5 mg once daily at night. Monitor for side effects and effectiveness. Continue Belsomra 5 mg for insomnia, monitoring for fatigue with Rexulti. Consider reducing Belsomra if Rexulti causes sleepiness. He is also following with neurology and has an appointment in December. Recent notes from neurology reviewed. Follow-up in 4 weeks.

## 2024-03-03 NOTE — Assessment & Plan Note (Signed)
 Continue januvia 100mg  daily and metformin 1,000mg  BID.

## 2024-03-03 NOTE — Assessment & Plan Note (Signed)
 Chronic, stable. Long-standing diabetes with A1c of 7.6% 6 months ago. Janumet is difficult to administer. Discontinue Janumet. Start Januvia 100 mg once daily and Metformin 1000 mg twice daily. Check A1c, CMP, CBC, urine microalbumin today. He follows regularly with ophthalmology.

## 2024-03-03 NOTE — Patient Instructions (Signed)
 It was great to see you!  We are checking your labs today and will let you know the results via mychart/phone.   Start rexulti 1 tablet daily   I have separated the janument, take metformin twice a day and januvia once a day  Let's follow-up in 4 weeks, sooner if you have concerns.  If a referral was placed today, you will be contacted for an appointment. Please note that routine referrals can sometimes take up to 3-4 weeks to process. Please call our office if you haven't heard anything after this time frame.  Take care,  Rheba Cedar, NP

## 2024-03-04 LAB — MICROALBUMIN / CREATININE URINE RATIO
Creatinine,U: 117.4 mg/dL
Microalb Creat Ratio: 19.2 mg/g (ref 0.0–30.0)
Microalb, Ur: 2.2 mg/dL — ABNORMAL HIGH (ref 0.0–1.9)

## 2024-03-05 ENCOUNTER — Telehealth: Payer: Self-pay

## 2024-03-05 ENCOUNTER — Other Ambulatory Visit (HOSPITAL_COMMUNITY): Payer: Self-pay

## 2024-03-05 NOTE — Telephone Encounter (Signed)
 Pharmacy Patient Advocate Encounter   Received notification from CoverMyMeds that prior authorization for Rexulti 0.5 is required/requested.   Insurance verification completed.   The patient is insured through [employer] .   Per test claim: PA required; PA submitted to above mentioned insurance via CoverMyMeds Key/confirmation #/EOC [id number] Status is pending

## 2024-03-08 ENCOUNTER — Other Ambulatory Visit (HOSPITAL_COMMUNITY): Payer: Self-pay

## 2024-03-08 NOTE — Telephone Encounter (Signed)
 Pharmacy Patient Advocate Encounter  Received notification from [employer] that Prior Authorization for Rexulti  0.5 has been APPROVED from 03/05/24 to 09/29/24. Unable to obtain price due to refill too soon rejection, last fill date 03/08/24 next available fill date 03/31/24   PA #/Case ID/Reference #: [id number]

## 2024-03-09 ENCOUNTER — Other Ambulatory Visit (HOSPITAL_COMMUNITY): Payer: Self-pay

## 2024-03-09 NOTE — Telephone Encounter (Addendum)
 I called and spoke with Cathleen Coach at [facility] pharmacy and notified her that prior Siegfried [employer] has been completed and approved through 09/29/24. [employer] has approved a qty of 30 pills per 30 days. They will fill patient's medication and get it ready to ship.

## 2024-03-16 DIAGNOSIS — H401131 Primary open-angle glaucoma, bilateral, mild stage: Secondary | ICD-10-CM | POA: Diagnosis not present

## 2024-04-01 DIAGNOSIS — H401131 Primary open-angle glaucoma, bilateral, mild stage: Secondary | ICD-10-CM | POA: Diagnosis not present

## 2024-04-01 LAB — HM DIABETES EYE EXAM

## 2024-04-05 ENCOUNTER — Encounter: Payer: Self-pay | Admitting: Nurse Practitioner

## 2024-04-05 ENCOUNTER — Ambulatory Visit (INDEPENDENT_AMBULATORY_CARE_PROVIDER_SITE_OTHER): Admitting: Nurse Practitioner

## 2024-04-05 VITALS — BP 128/68 | HR 55 | Temp 97.1°F | Ht 66.0 in | Wt 147.4 lb

## 2024-04-05 DIAGNOSIS — Z7984 Long term (current) use of oral hypoglycemic drugs: Secondary | ICD-10-CM

## 2024-04-05 DIAGNOSIS — G301 Alzheimer's disease with late onset: Secondary | ICD-10-CM

## 2024-04-05 DIAGNOSIS — E785 Hyperlipidemia, unspecified: Secondary | ICD-10-CM | POA: Diagnosis not present

## 2024-04-05 DIAGNOSIS — G14 Postpolio syndrome: Secondary | ICD-10-CM | POA: Diagnosis not present

## 2024-04-05 DIAGNOSIS — F02C2 Dementia in other diseases classified elsewhere, severe, with psychotic disturbance: Secondary | ICD-10-CM

## 2024-04-05 DIAGNOSIS — Z0000 Encounter for general adult medical examination without abnormal findings: Secondary | ICD-10-CM | POA: Diagnosis not present

## 2024-04-05 DIAGNOSIS — H409 Unspecified glaucoma: Secondary | ICD-10-CM

## 2024-04-05 DIAGNOSIS — E119 Type 2 diabetes mellitus without complications: Secondary | ICD-10-CM

## 2024-04-05 MED ORDER — REXULTI 0.5 MG PO TABS: ORAL_TABLET | ORAL | Status: AC

## 2024-04-05 NOTE — Assessment & Plan Note (Signed)
 Chronic, stable. Continue Januvia  100 mg once daily and Metformin  1000 mg twice daily. Sugars are well controlled, recent A1c 8%. He follows regularly with ophthalmology.

## 2024-04-05 NOTE — Assessment & Plan Note (Addendum)
 Progressive cognitive decline with exacerbated symptoms. He is still having issues with leaving the house and wandering. Will increase rexulti  to 1mg  for 7 days, then 1.5mg  for 7 days, then 2 mg daily. Continue Belsomra 5 mg for insomnia, monitoring for fatigue with Rexulti . His wife is planning on moving in with his children to help keep an eye on him. They decline home health. He is also following with neurology. Follow-up in 2-3 months.

## 2024-04-05 NOTE — Progress Notes (Addendum)
 BP 128/68 (BP Location: Left Arm, Patient Position: Sitting, Cuff Size: Normal)   Pulse (!) 55   Temp (!) 97.1 F (36.2 C)   Ht 5' 6 (1.676 m)   Wt 147 lb 6.4 oz (66.9 kg)   SpO2 100%   BMI 23.79 kg/m    Subjective:    Patient ID: Jeffery Stewart, male    DOB: Mar 29, 1936, 88 y.o.   MRN: [id number]  CC: Chief Complaint  Patient presents with   Annual Exam    Follow up with starting on Rexulti     HPI: Jeffery Stewart is a 88 y.o. male presenting on 04/05/2024 for comprehensive medical examination. Current medical complaints include:wandering, dementia  Discussed the use of AI scribe software for clinical note transcription with the patient, who gave verbal consent to proceed.  History of Present Illness   The patient, with post-polio syndrome, presents with sleep disturbances and behavioral concerns.  He experiences significant sleep disturbances, waking at 2 to 3 AM for the past three nights and exiting the house through a window. His wife observes that he appears 'wide awake' and 'proud' after these incidents. To monitor him, his caregiver sleeps on the couch while he sleeps in a recliner. He has been on Rexulti  for three weeks without side effects or improvement. The plan is to move in with his children to help keep him safe.   Nutritional concerns include a weight decrease from 150 lbs to 145 lbs. He consumes a high-protein milkshake with unsweetened almond milk and banana, providing 30 mg of protein per cup. His eating habits are inconsistent, and fasting blood sugars range from 164 to 168.  He produces significant phlegm, experiences a sore throat, and coughs up phlegm, especially after dairy consumption. He takes Pepcid  regularly. He failed a swallow evaluation in the past, so his wife ensures he sits up while eating.      He currently lives with: wife  Depression and Anxiety Screen done today and results listed below:     04/05/2024   10:42 AM 03/03/2024    1:21 PM  Depression  screen PHQ 2/9  Decreased Interest 3 2  Down, Depressed, Hopeless 2 1  PHQ - 2 Score 5 3  Altered sleeping 3 2  Tired, decreased energy 3 3  Change in appetite 3 2  Feeling bad or failure about yourself  3 2  Trouble concentrating 3 3  Moving slowly or fidgety/restless 3 3  Suicidal thoughts 3 1  PHQ-9 Score 26 19  Difficult doing work/chores Very difficult Extremely dIfficult      04/05/2024   10:42 AM 03/03/2024    1:21 PM  GAD 7 : Generalized Anxiety Score  Nervous, Anxious, on Edge 3 3  Control/stop worrying 3 3  Worry too much - different things 3 3  Trouble relaxing 3 2  Restless 3 2  Easily annoyed or irritable 2 2  Afraid - awful might happen 3 2  Total GAD 7 Score 20 17  Anxiety Difficulty Very difficult Extremely difficult    The patient has a history of falls. I did complete a risk assessment for falls. A plan of care for falls was documented.   Past Medical History:  Past Medical History:  Diagnosis Date   Arthritis    Dementia (HCC) 2012   Diabetes mellitus without complication (HCC)    GERD (gastroesophageal reflux disease)    Glaucoma    History of prostate cancer    Neuropathy    Post-polio  syndrome    possible weakned intercostal muscles, fatigues easily    Surgical History:  Past Surgical History:  Procedure Laterality Date   EYE SURGERY     bilateral stent for glaucoma   INSERTION PROSTATE RADIATION SEED  2007   MELANOMA EXCISION Left 2016   elbow    Medications:  Current Outpatient Medications on File Prior to Visit  Medication Sig   Accu-Chek FastClix Lancets MISC USE 1  TO CHECK GLUCOSE ONCE DAILY   acetaminophen  (TYLENOL ) 500 MG tablet Take 500 mg by mouth daily as needed for mild pain or moderate pain. Take as needed for back/neck pain.   b complex vitamins capsule Take 1 capsule by mouth daily.   Blood Glucose Monitoring Suppl (GLUCOCOM BLOOD GLUCOSE MONITOR) DEVI 1 Device by Misc.(Non-Drug; Combo Route) route 4 (four) times daily.    clonazePAM  (KLONOPIN ) 1 MG tablet Take 1 mg by mouth at bedtime.   cycloSPORINE  (RESTASIS ) 0.05 % ophthalmic emulsion Place 1 drop into both eyes 2 (two) times daily.   DULoxetine  (CYMBALTA ) 60 MG capsule Take 60 mg by mouth daily.   famotidine  (PEPCID ) 20 MG tablet Take 20 mg by mouth 2 (two) times daily.   gabapentin  (NEURONTIN ) 300 MG capsule Take 300 mg by mouth daily at 6 (six) AM. Patients take 7 capsules a day.   glucose blood (ACCU-CHEK GUIDE TEST) test strip Use as instructed   lidocaine (LIDODERM) 5 % Place 1 patch onto the skin daily. Remove & Discard patch within 12 hours or as directed by MD   metFORMIN  (GLUCOPHAGE ) 1000 MG tablet Take 1 tablet (1,000 mg total) by mouth 2 (two) times daily with a meal.   nitroGLYCERIN  (NITROSTAT ) 0.4 MG SL tablet Place 1 tablet (0.4 mg total) under the tongue every 5 (five) minutes x 3 doses as needed for chest pain.   sitaGLIPtin  (JANUVIA ) 100 MG tablet Take 1 tablet (100 mg total) by mouth daily.   Suvorexant (BELSOMRA) 5 MG TABS Take by mouth at bedtime.   No current facility-administered medications on file prior to visit.    Allergies:  Allergies  Allergen Reactions   Prednisone Other (See Comments)   Garlic Nausea Only and Other (See Comments)    Social History:  Social History   Socioeconomic History   Marital status: Married    Spouse name: Not on file   Number of children: Not on file   Years of education: Not on file   Highest education level: Not on file  Occupational History   Not on file  Tobacco Use   Smoking status: Never   Smokeless tobacco: Never  Substance and Sexual Activity   Alcohol use: Yes    Comment: occasional   Drug use: Never   Sexual activity: Not on file  Other Topics Concern   Not on file  Social History Narrative   Not on file   Social Drivers of Health   Financial Resource Strain: Low Risk  (04/20/2020)   Received from [facility]   Overall Financial Resource Strain  (CARDIA)    Difficulty of Paying Living Expenses: Not hard at all  Food Insecurity: No Food Insecurity (02/10/2023)   Hunger Vital Sign    Worried About Running Out of Food in the Last Year: Never true    Ran Out of Food in the Last Year: Never true  Transportation Needs: No Transportation Needs (02/10/2023)   PRAPARE - Administrator, Civil Service (Medical): No  Lack of Transportation (Non-Medical): No  Physical Activity: Not on file  Stress: Not on file  Social Connections: Unknown (11/26/2022)   [facility] from [facility]   Social Network    Social Network: Not on file  Intimate Partner Violence: Not At Risk (02/10/2023)   Humiliation, Afraid, Rape, and Kick questionnaire    Fear of Current or Ex-Partner: No    Emotionally Abused: No    Physically Abused: No    Sexually Abused: No   Social History   Tobacco Use  Smoking Status Never  Smokeless Tobacco Never   Social History   Substance and Sexual Activity  Alcohol Use Yes   Comment: occasional    Family History:  Family History  Problem Relation Age of Onset   CAD Brother    CAD Daughter 28    Past medical history, surgical history, medications, allergies, family history and social history reviewed with patient today and changes made to appropriate areas of the chart.   Review of Systems  Constitutional:  Positive for weight loss. Negative for malaise/fatigue.  HENT: Negative.    Eyes: Negative.   Respiratory: Negative.    Cardiovascular: Negative.   Gastrointestinal: Negative.   Genitourinary: Negative.   Musculoskeletal: Negative.   Skin: Negative.   Neurological: Negative.   Psychiatric/Behavioral:  Positive for memory loss. The patient has insomnia.        Wandering   All other ROS negative except what is listed above and in the HPI.      Objective:    BP 128/68 (BP Location: Left Arm, Patient Position: Sitting, Cuff Size: Normal)   Pulse (!) 55   Temp (!) 97.1 F (36.2 C)   Ht 5'  6 (1.676 m)   Wt 147 lb 6.4 oz (66.9 kg)   SpO2 100%   BMI 23.79 kg/m   Wt Readings from Last 3 Encounters:  04/05/24 147 lb 6.4 oz (66.9 kg)  03/03/24 150 lb 6.4 oz (68.2 kg)  02/27/23 152 lb 12.8 oz (69.3 kg)    Physical Exam Vitals and nursing note reviewed.  Constitutional:      General: He is not in acute distress.    Appearance: Normal appearance.  HENT:     Head: Normocephalic and atraumatic.     Right Ear: Tympanic membrane, ear canal and external ear normal.     Left Ear: Tympanic membrane, ear canal and external ear normal.  Eyes:     Conjunctiva/sclera: Conjunctivae normal.  Cardiovascular:     Rate and Rhythm: Normal rate and regular rhythm.     Pulses: Normal pulses.     Heart sounds: Normal heart sounds.  Pulmonary:     Effort: Pulmonary effort is normal.     Breath sounds: Normal breath sounds.  Abdominal:     Palpations: Abdomen is soft.     Tenderness: There is no abdominal tenderness.  Musculoskeletal:        General: Normal range of motion.     Cervical back: Normal range of motion and neck supple. No tenderness.     Right lower leg: No edema.     Left lower leg: No edema.  Lymphadenopathy:     Cervical: No cervical adenopathy.  Skin:    General: Skin is warm and dry.  Neurological:     Mental Status: He is alert. Mental status is at baseline. He is disoriented.     Cranial Nerves: No cranial nerve deficit.     Coordination: Coordination normal.  Gait: Gait normal.  Psychiatric:        Mood and Affect: Mood normal.        Behavior: Behavior normal.        Thought Content: Thought content normal.        Cognition and Memory: Cognition is impaired. Memory is impaired.        Judgment: Judgment is impulsive.     Results for orders placed or performed in visit on 03/03/24  CBC with Differential/Platelet   Collection Time: 03/03/24  2:20 PM  Result Value Ref Range   WBC 6.0 4.0 - 10.5 K/uL   RBC 4.42 4.22 - 5.81 Mil/uL   Hemoglobin 13.2  13.0 - 17.0 g/dL   HCT 59.4 60.9 - 47.9 %   MCV 91.5 78.0 - 100.0 fl   MCHC 32.7 30.0 - 36.0 g/dL   RDW 85.5 88.4 - 84.4 %   Platelets 322.0 150.0 - 400.0 K/uL   Neutrophils Relative % 64.0 43.0 - 77.0 %   Lymphocytes Relative 27.3 12.0 - 46.0 %   Monocytes Relative 6.4 3.0 - 12.0 %   Eosinophils Relative 1.8 0.0 - 5.0 %   Basophils Relative 0.5 0.0 - 3.0 %   Neutro Abs 3.9 1.4 - 7.7 K/uL   Lymphs Abs 1.7 0.7 - 4.0 K/uL   Monocytes Absolute 0.4 0.1 - 1.0 K/uL   Eosinophils Absolute 0.1 0.0 - 0.7 K/uL   Basophils Absolute 0.0 0.0 - 0.1 K/uL  Comprehensive metabolic panel with GFR   Collection Time: 03/03/24  2:20 PM  Result Value Ref Range   Sodium 137 135 - 145 mEq/L   Potassium 4.4 3.5 - 5.1 mEq/L   Chloride 101 96 - 112 mEq/L   CO2 29 19 - 32 mEq/L   Glucose, Bld 173 (H) 70 - 99 mg/dL   BUN 12 6 - 23 mg/dL   Creatinine, Ser 8.56 0.40 - 1.50 mg/dL   Total Bilirubin 0.4 0.2 - 1.2 mg/dL   Alkaline Phosphatase 48 39 - 117 U/L   AST 14 0 - 37 U/L   ALT 10 0 - 53 U/L   Total Protein 7.2 6.0 - 8.3 g/dL   Albumin 4.6 3.5 - 5.2 g/dL   GFR 55.42 (L) >39.99 mL/min   Calcium  9.4 8.4 - 10.5 mg/dL  Lipid panel   Collection Time: 03/03/24  2:20 PM  Result Value Ref Range   Cholesterol 204 (H) 0 - 200 mg/dL   Triglycerides 730.9 (H) 0.0 - 149.0 mg/dL   HDL 61.89 (L) >60.99 mg/dL   VLDL 46.1 (H) 0.0 - 59.9 mg/dL   LDL Cholesterol 887 (H) 0 - 99 mg/dL   Total CHOL/HDL Ratio 5    NonHDL 165.93   Hemoglobin A1c   Collection Time: 03/03/24  2:20 PM  Result Value Ref Range   Hgb A1c MFr Bld 8.0 (H) 4.6 - 6.5 %  Microalbumin / creatinine urine ratio   Collection Time: 03/03/24  2:20 PM  Result Value Ref Range   Microalb, Ur 2.2 (H) 0.0 - 1.9 mg/dL   Creatinine,U 882.5 mg/dL   Microalb Creat Ratio 19.2 0.0 - 30.0 mg/g  POCT urinalysis dipstick   Collection Time: 03/03/24  2:30 PM  Result Value Ref Range   Color, UA straw    Clarity, UA clear    Glucose, UA Negative Negative    Bilirubin, UA negative    Ketones, UA negative    Spec Grav, UA 1.020 1.010 - 1.025  Blood, UA negative    pH, UA 6.0 5.0 - 8.0   Protein, UA Positive (A) Negative   Urobilinogen, UA 0.2 0.2 or 1.0 E.U./dL   Nitrite, UA negative    Leukocytes, UA Negative Negative   Appearance yellow    Odor        Assessment & Plan:   Problem List Items Addressed This Visit       Endocrine   Diabetes mellitus without complication (HCC)   Chronic, stable. Continue Januvia  100 mg once daily and Metformin  1000 mg twice daily. Sugars are well controlled, recent A1c 8%. He follows regularly with ophthalmology.         Nervous and Auditory   Dementia (HCC)   Progressive cognitive decline with exacerbated symptoms. He is still having issues with leaving the house and wandering. Will increase rexulti  to 1mg  for 7 days, then 1.5mg  for 7 days, then 2 mg daily. Continue Belsomra 5 mg for insomnia, monitoring for fatigue with Rexulti . His wife is planning on moving in with his children to help keep an eye on him. They decline home health. He is also following with neurology. Follow-up in 2-3 months.       Relevant Medications   Brexpiprazole  (REXULTI ) 0.5 MG TABS   Post-polio syndrome   Chronic condition with increased symptoms affecting swallowing and vasovagal responses. Continue making sure food is well chewed and follow-up with any concerns.         Other   Dyslipidemia (Chronic)   Chronic, stable. Not currently taking statin as it caused muscle cramping, pain and confusion.       Long term current use of oral hypoglycemic drug   Continue januvia  100mg  daily and metformin  1,000mg  BID.       Glaucoma of both eyes   Managed with Hydrus stents and laser treatment. No current eye drops. Continue follow-up with ophthalmologist.      Routine general medical examination at a health care facility - Primary   Health maintenance reviewed and updated. Discussed nutrition, exercise. Follow-up 1 year.          IMMUNIZATIONS:   - Tdap: Tetanus vaccination status reviewed: last tetanus booster within 10 years. - Influenza: Postponed to flu season - Pneumovax: Up to date - Prevnar: Up to date - HPV: Not applicable - Shingrix vaccine: Up to date  SCREENING: - Colonoscopy: Not applicable  Discussed with patient purpose of the colonoscopy is to detect colon cancer at curable precancerous or early stages   - AAA Screening: Not applicable   PATIENT COUNSELING:    Sexuality: Discussed sexually transmitted diseases, partner selection, use of condoms, avoidance of unintended pregnancy  and contraceptive alternatives.   Advised to avoid cigarette smoking.  I discussed with the patient that most people either abstain from alcohol or drink within safe limits (<=14/week and <=4 drinks/occasion for males, <=7/weeks and <= 3 drinks/occasion for females) and that the risk for alcohol disorders and other health effects rises proportionally with the number of drinks per week and how often a drinker exceeds daily limits.  Discussed cessation/primary prevention of drug use and availability of treatment for abuse.   Diet: Encouraged to adjust caloric intake to maintain  or achieve ideal body weight, to reduce intake of dietary saturated fat and total fat, to limit sodium intake by avoiding high sodium foods and not adding table salt, and to maintain adequate dietary potassium and calcium  preferably from fresh fruits, vegetables, and low-fat dairy products.    stressed  the importance of regular exercise  Injury prevention: Discussed safety belts, safety helmets, smoke detector, smoking near bedding or upholstery.   Dental health: Discussed importance of regular tooth brushing, flossing, and dental visits.   Follow up plan: NEXT PREVENTATIVE PHYSICAL DUE IN 1 YEAR. Return in about 3 months (around 07/06/2024) for 2-3 months, dementia .  Malaak Stach A Briya Lookabaugh

## 2024-04-05 NOTE — Assessment & Plan Note (Signed)
 Chronic condition with increased symptoms affecting swallowing and vasovagal responses. Continue making sure food is well chewed and follow-up with any concerns.

## 2024-04-05 NOTE — Patient Instructions (Signed)
 It was great to see you!  Increase the rexulti  to 2 tablets daily for 7 days then 3 tablets daily for 7 days, then 4 tablets daily. When you get to this dose, call the pharmacy and they will send the 2mg  tablet   Let's follow-up in 2-3 months, sooner if you have concerns.  If a referral was placed today, you will be contacted for an appointment. Please note that routine referrals can sometimes take up to 3-4 weeks to process. Please call our office if you haven't heard anything after this time frame.  Take care,  Tinnie Harada, NP

## 2024-04-05 NOTE — Assessment & Plan Note (Signed)
Health maintenance reviewed and updated. Discussed nutrition, exercise. Follow-up 1 year.

## 2024-04-05 NOTE — Assessment & Plan Note (Signed)
 Continue januvia 100mg  daily and metformin 1,000mg  BID.

## 2024-04-05 NOTE — Assessment & Plan Note (Signed)
 Managed with Hydrus stents and laser treatment. No current eye drops. Continue follow-up with ophthalmologist.

## 2024-04-05 NOTE — Assessment & Plan Note (Signed)
 Chronic, stable. Not currently taking statin as it caused muscle cramping, pain and confusion.

## 2024-04-20 ENCOUNTER — Telehealth: Payer: Self-pay | Admitting: Nurse Practitioner

## 2024-04-20 NOTE — Telephone Encounter (Signed)
 Patient requests Rexulti  2mg , not on profile

## 2024-04-20 NOTE — Telephone Encounter (Unsigned)
 Copied from CRM [id number]. Topic: Clinical - Medication Refill >> Apr 20, 2024  3:21 PM Viola F wrote: Medication: Brexpiprazole  (REXULTI ) 2MG  TABS [[id number]]  ENDED  Has the patient contacted their pharmacy? Yes (Agent: If no, request that the patient contact the pharmacy for the refill. If patient does not wish to contact the pharmacy document the reason why and proceed with request.) (Agent: If yes, when and what did the pharmacy advise?)  This is the patient's preferred pharmacy:    [facility] - [place], [place] - [address] [place] [address] Phone: [phone] Fax: [phone]  Is this the correct pharmacy for this prescription? Yes If no, delete pharmacy and type the correct one.   Has the prescription been filled recently? Yes  Is the patient out of the medication? Yes  Has the patient been seen for an appointment in the last year OR does the patient have an upcoming appointment? Yes  Can we respond through MyChart? Yes  Agent: Please be advised that Rx refills may take up to 3 business days. We ask that you follow-up with your pharmacy.

## 2024-04-21 NOTE — Telephone Encounter (Signed)
 Requesting: Brexpiprazole  (REXULTI ) 2MG  TABS  Last Visit: 04/05/2024 Next Visit: 04/29/2024 Last Refill: 04/05/2024 and ended on 04/19/24.  Please Advise

## 2024-04-21 NOTE — Telephone Encounter (Signed)
 Copied from CRM [id number]. Topic: Clinical - Medication Question >> Apr 21, 2024  8:45 AM Chasity T wrote: Reason for CRM: Avelina wife of patient is calling to request that the Brexpiprazole  (REXULTI ) 2MG  TABS be sent to the [facility] in chart instead of the [facility].

## 2024-04-21 NOTE — Telephone Encounter (Signed)
 Left message for patient or wife to return call. Wanted to check if patient has started medication.

## 2024-04-22 MED ORDER — BREXPIPRAZOLE 2 MG PO TABS: 2.0000 mg | ORAL_TABLET | Freq: Every day | ORAL | 0 refills | Status: DC

## 2024-04-22 NOTE — Telephone Encounter (Signed)
 I called and notified patient's wife that Rx was sent to his pharmacy.     Mrs. Lenderman wanted you to know that Treyson twisted her arm twice today and she called Dr. Medford and left a message with his office. She was told by Dr. Medford if this ever was to happen to call him in which she did and wanted you to know.

## 2024-04-22 NOTE — Telephone Encounter (Signed)
 Noted - will see what neurology says.

## 2024-04-22 NOTE — Telephone Encounter (Signed)
 Copied from CRM [id number]. Topic: Clinical - Medication Question >> Apr 21, 2024  8:45 AM Chasity T wrote: Reason for CRM: Avelina wife of patient is calling to request that the Brexpiprazole  (REXULTI ) 2MG  TABS be sent to [facility]. >> Apr 22, 2024 10:37 AM Drema MATSU wrote: Patient wife stated that he can have a 2mg  tablet and it needs to be called in to [facility] ([address]) She stated that he is taking 4 of the .5 now.

## 2024-04-22 NOTE — Telephone Encounter (Signed)
 I called and spoke with patient's wife and insurance wants them to get a 30 day supply at a time and to [facility] [address].  Brexpiprazole  (REXULTI ) 2MG  TABS

## 2024-04-29 ENCOUNTER — Telehealth: Payer: Self-pay | Admitting: Nurse Practitioner

## 2024-04-29 ENCOUNTER — Ambulatory Visit: Admitting: Nurse Practitioner

## 2024-04-29 ENCOUNTER — Encounter: Payer: Self-pay | Admitting: Nurse Practitioner

## 2024-04-29 VITALS — BP 128/70 | HR 58 | Temp 97.2°F | Ht 66.0 in | Wt 149.2 lb

## 2024-04-29 DIAGNOSIS — F02C2 Dementia in other diseases classified elsewhere, severe, with psychotic disturbance: Secondary | ICD-10-CM

## 2024-04-29 DIAGNOSIS — R351 Nocturia: Secondary | ICD-10-CM | POA: Diagnosis not present

## 2024-04-29 DIAGNOSIS — R2681 Unsteadiness on feet: Secondary | ICD-10-CM | POA: Diagnosis not present

## 2024-04-29 DIAGNOSIS — G4709 Other insomnia: Secondary | ICD-10-CM | POA: Diagnosis not present

## 2024-04-29 DIAGNOSIS — G301 Alzheimer's disease with late onset: Secondary | ICD-10-CM | POA: Diagnosis not present

## 2024-04-29 MED ORDER — TAMSULOSIN HCL 0.4 MG PO CAPS: 0.4000 mg | ORAL_CAPSULE | Freq: Every day | ORAL | 1 refills | Status: DC

## 2024-04-29 MED ORDER — CLONAZEPAM 1 MG PO TABS: 1.0000 mg | ORAL_TABLET | Freq: Every day | ORAL | 0 refills | Status: AC

## 2024-04-29 NOTE — Assessment & Plan Note (Signed)
 He has nocturia and lower urinary tract symptoms possibly due to benign prostatic hyperplasia. Prostate issues contributing to symptoms were discussed, and Flomax  is suggested to reduce urinary frequency. Start Flomax  40mg  once daily after dinner.

## 2024-04-29 NOTE — Progress Notes (Signed)
 Established Patient Office Visit  Subjective   Patient ID: Jeffery Stewart, male    DOB: 1936-03-17  Age: 88 y.o. MRN: [id number]  Chief Complaint  Patient presents with   Dementia    Request for form completion    HPI Discussed the use of AI scribe software for clinical note transcription with the patient, who gave verbal consent to proceed.  History of Present Illness   Jeffery Stewart is an 88 year old male with dementia who presents for medication management and assistance with daily living activities.  He experiences hallucinations, delusions, and aggression. Rexulti  2 mg has been taken for ten days without noticeable change in symptoms or side effects. He requires assistance with dressing and bathing, manages toileting with occasional incontinence, and uses a walker in the mornings. He needs help getting out of bed and experiences near falls every two to three days, with one fall in the past month.  His sleep is disrupted, waking between 1:30 and 4:30 AM, talking or singing. Despite taking Belsomra and Klonopin  at night, he has difficulty sleeping through the night and sometimes wakes to urinate once or twice.  He is losing weight and sometimes refuses to eat, though he drinks fluids well. He occasionally needs encouragement to swallow food and medication, sometimes pocketing pills.  He experiences frequent urination during the day and sometimes has difficulty starting urination.       ROS See pertinent positives and negatives per HPI.    Objective:     BP 128/70 (BP Location: Right Arm, Patient Position: Sitting, Cuff Size: Small)   Pulse (!) 58   Temp (!) 97.2 F (36.2 C)   Ht 5' 6 (1.676 m)   Wt 149 lb 3.2 oz (67.7 kg)   SpO2 97%   BMI 24.08 kg/m  BP Readings from Last 3 Encounters:  04/29/24 128/70  04/05/24 128/68  03/03/24 112/70   Wt Readings from Last 3 Encounters:  04/29/24 149 lb 3.2 oz (67.7 kg)  04/05/24 147 lb 6.4 oz (66.9 kg)  03/03/24 150 lb 6.4  oz (68.2 kg)      Physical Exam Vitals and nursing note reviewed.  Constitutional:      Appearance: Normal appearance.  HENT:     Head: Normocephalic.  Eyes:     Conjunctiva/sclera: Conjunctivae normal.  Cardiovascular:     Rate and Rhythm: Normal rate and regular rhythm.     Pulses: Normal pulses.     Heart sounds: Normal heart sounds.  Pulmonary:     Effort: Pulmonary effort is normal.     Breath sounds: Normal breath sounds.  Musculoskeletal:     Cervical back: Normal range of motion.     Comments: Shuffling gait, strength 5/5 in upper extremities, 4/5 in lower extremities. Difficulty with fine motor skills  Skin:    General: Skin is warm.  Neurological:     Mental Status: He is alert. Mental status is at baseline. He is disoriented.  Psychiatric:        Mood and Affect: Mood normal.        Behavior: Behavior normal.        Thought Content: Thought content normal.        Judgment: Judgment normal.      Assessment & Plan:   Problem List Items Addressed This Visit       Nervous and Auditory   Dementia (HCC) - Primary   He experiences dementia with hallucinations, delusions, and aggression. Rexulti  2 mg has been  taken for 10 days without change or side effects, with full effect expected in 6-12 weeks. Continue Rexulti  2 mg daily. Discuss hospice and palliative care options with Dr. Dannial and complete [employer] [facility] paperwork for in-home aid.      Relevant Medications   clonazePAM  (KLONOPIN ) 1 MG tablet     Other   Gait instability   He has gait instability with near falls every 2-3 days and one fall last month. He uses a walker in the mornings and needs assistance out of bed. Falls are a concern with nocturnal wandering. Encourage use of the walker and complete [employer] paperwork for in-home aid.      Nocturia   He has nocturia and lower urinary tract symptoms possibly due to benign prostatic hyperplasia. Prostate issues contributing to  symptoms were discussed, and Flomax  is suggested to reduce urinary frequency. Start Flomax  40mg  once daily after dinner.       Other insomnia   He suffers from insomnia with frequent nocturnal awakenings. Currently on Belsomra and Klonopin , there is a discussion about increasing Klonopin  for sleep, with concern about higher doses affecting walking. Consider timing adjustment for Klonopin . Increase Klonopin  to 2 mg at bedtime or 1 mg at bedtime and 1 mg if he wakes up. Continue Belsomra 5 mg at bedtime and refill Klonopin  prescription.      Return if symptoms worsen or fail to improve.    Tinnie DELENA Harada, NP

## 2024-04-29 NOTE — Assessment & Plan Note (Signed)
 He has gait instability with near falls every 2-3 days and one fall last month. He uses a walker in the mornings and needs assistance out of bed. Falls are a concern with nocturnal wandering. Encourage use of the walker and complete [employer] paperwork for in-home aid.

## 2024-04-29 NOTE — Patient Instructions (Signed)
 It was great to see you!  Keep taking rexulti   Increase klonopin  to 1-2 tablets at bedtime or 1 tablet at bedtime and 1 tablet if he wakes up at 1am.   Start flomax  once a day after dinner to help with urination   Let's follow-up at your next visit, sooner if you have concerns.  If a referral was placed today, you will be contacted for an appointment. Please note that routine referrals can sometimes take up to 3-4 weeks to process. Please call our office if you haven't heard anything after this time frame.  Take care,  Tinnie Harada, NP

## 2024-04-29 NOTE — Telephone Encounter (Signed)
 done

## 2024-04-29 NOTE — Assessment & Plan Note (Signed)
 He experiences dementia with hallucinations, delusions, and aggression. Rexulti  2 mg has been taken for 10 days without change or side effects, with full effect expected in 6-12 weeks. Continue Rexulti  2 mg daily. Discuss hospice and palliative care options with Dr. Dannial and complete [employer] paperwork for in-home aid.
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
## 2024-04-29 NOTE — Assessment & Plan Note (Signed)
 He suffers from insomnia with frequent nocturnal awakenings. Currently on Belsomra and Klonopin , there is a discussion about increasing Klonopin  for sleep, with concern about higher doses affecting walking. Consider timing adjustment for Klonopin . Increase Klonopin  to 2 mg at bedtime or 1 mg at bedtime and 1 mg if he wakes up. Continue Belsomra 5 mg at bedtime and refill Klonopin  prescription.

## 2024-04-29 NOTE — Telephone Encounter (Signed)
 I called and spoke with patient's wife and she will pick up forms tomorrow.   Appointment was canceled.
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
## 2024-05-04 ENCOUNTER — Telehealth: Payer: Self-pay

## 2024-05-04 DIAGNOSIS — Z0279 Encounter for issue of other medical certificate: Secondary | ICD-10-CM

## 2024-05-04 NOTE — Telephone Encounter (Signed)
 I tried to call patient's wife and unable to leave a message. Will try to call again later.

## 2024-05-04 NOTE — Telephone Encounter (Signed)
 CLINICAL USE BELOW THIS LINE (use X to signify action taken)  _X_ Form received and placed in providers office for signature. ___ Form completed and faxed to LOA Dept.  ___ Form completed & LVM to notify patient ready for pick up.  ___ Charge sheet and copy of form in front office folder for office supervisor.    Patient's wife dropped off paperwork for completion

## 2024-05-05 ENCOUNTER — Encounter: Payer: Self-pay | Admitting: Nurse Practitioner

## 2024-05-05 NOTE — Telephone Encounter (Signed)
 Noted
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
## 2024-05-05 NOTE — Telephone Encounter (Signed)
Noted,allergy updated.

## 2024-05-11 ENCOUNTER — Telehealth: Payer: Self-pay

## 2024-05-11 NOTE — Telephone Encounter (Signed)
 I called patient's wife and left a voicemail that forms were refaxed to below fax number.

## 2024-05-11 NOTE — Telephone Encounter (Signed)
 Copied from CRM [id number]. Topic: Clinical - Order For Equipment >> May 11, 2024  3:12 PM Berneda FALCON wrote: Reason for CRM: Need [place] to fax again DME order for hospital bed at memory care unit where he is going. There is a new fax number which is [phone] [facility].  Please call wife back once this is done-[phone]

## 2024-05-19 ENCOUNTER — Ambulatory Visit: Admitting: Nurse Practitioner

## 2024-05-28 ENCOUNTER — Ambulatory Visit: Payer: Self-pay

## 2024-05-28 NOTE — Telephone Encounter (Signed)
 Noted appointment made for 06/01/24.
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
## 2024-06-01 ENCOUNTER — Ambulatory Visit: Admitting: Nurse Practitioner

## 2024-06-01 ENCOUNTER — Other Ambulatory Visit: Payer: Self-pay | Admitting: Nurse Practitioner

## 2024-06-01 ENCOUNTER — Encounter: Payer: Self-pay | Admitting: Nurse Practitioner

## 2024-06-01 VITALS — BP 126/70 | HR 78 | Temp 97.3°F | Ht 66.0 in | Wt 147.6 lb

## 2024-06-01 DIAGNOSIS — F02C2 Dementia in other diseases classified elsewhere, severe, with psychotic disturbance: Secondary | ICD-10-CM

## 2024-06-01 DIAGNOSIS — W19XXXD Unspecified fall, subsequent encounter: Secondary | ICD-10-CM

## 2024-06-01 DIAGNOSIS — R2681 Unsteadiness on feet: Secondary | ICD-10-CM | POA: Diagnosis not present

## 2024-06-01 DIAGNOSIS — W19XXXA Unspecified fall, initial encounter: Secondary | ICD-10-CM | POA: Insufficient documentation

## 2024-06-01 DIAGNOSIS — Z23 Encounter for immunization: Secondary | ICD-10-CM

## 2024-06-01 DIAGNOSIS — Z7984 Long term (current) use of oral hypoglycemic drugs: Secondary | ICD-10-CM | POA: Diagnosis not present

## 2024-06-01 DIAGNOSIS — G301 Alzheimer's disease with late onset: Secondary | ICD-10-CM

## 2024-06-01 DIAGNOSIS — E119 Type 2 diabetes mellitus without complications: Secondary | ICD-10-CM | POA: Diagnosis not present

## 2024-06-01 DIAGNOSIS — R351 Nocturia: Secondary | ICD-10-CM | POA: Diagnosis not present

## 2024-06-01 LAB — POCT GLYCOSYLATED HEMOGLOBIN (HGB A1C)
HbA1c POC (<> result, manual entry): 7.6 % (ref 4.0–5.6)
HbA1c, POC (controlled diabetic range): 7.6 % — AB (ref 0.0–7.0)
HbA1c, POC (prediabetic range): 7.6 % — AB (ref 5.7–6.4)
Hemoglobin A1C: 7.6 % — AB (ref 4.0–5.6)

## 2024-06-01 LAB — BASIC METABOLIC PANEL WITH GFR
BUN: 19 mg/dL (ref 6–23)
CO2: 27 meq/L (ref 19–32)
Calcium: 9.4 mg/dL (ref 8.4–10.5)
Chloride: 100 meq/L (ref 96–112)
Creatinine, Ser: 1.25 mg/dL (ref 0.40–1.50)
GFR: 52.28 mL/min — ABNORMAL LOW (ref >60.00)
Glucose, Bld: 147 mg/dL — ABNORMAL HIGH (ref 70–99)
Potassium: 4.1 meq/L (ref 3.5–5.1)
Sodium: 138 meq/L (ref 135–145)

## 2024-06-01 LAB — CBC
HCT: 37.5 % — ABNORMAL LOW (ref 39.0–52.0)
Hemoglobin: 12.2 g/dL — ABNORMAL LOW (ref 13.0–17.0)
MCHC: 32.7 g/dL (ref 30.0–36.0)
MCV: 91.4 fl (ref 78.0–100.0)
Platelets: 373 K/uL (ref 150.0–400.0)
RBC: 4.1 Mil/uL — ABNORMAL LOW (ref 4.22–5.81)
RDW: 14.4 % (ref 11.5–15.5)
WBC: 6.9 K/uL (ref 4.0–10.5)

## 2024-06-01 LAB — FERRITIN: Ferritin: 33.6 ng/mL (ref 22.0–322.0)

## 2024-06-01 LAB — IRON: Iron: 81 ug/dL (ref 42–165)

## 2024-06-01 LAB — VITAMIN B12: Vitamin B-12: 201 pg/mL — ABNORMAL LOW (ref 211–911)

## 2024-06-01 MED ORDER — REXULTI 0.5 MG PO TABS: ORAL_TABLET | ORAL | 0 refills | Status: DC

## 2024-06-01 NOTE — Progress Notes (Signed)
 Established Patient Office Visit  Subjective   Patient ID: Jeffery Stewart, male    DOB: 07/05/1936  Age: 88 y.o. MRN: [id number]  Chief Complaint  Patient presents with   Fall    Family concerned with recent falls-abrasion to left forearm and bruising to right forearm and bruise under right eye    HPI  Jeffery Stewart is here to follow-up on diabetes, dementia, and recent falls. He is accompanied by his wife and 2 children.   He has been experiencing more falls the past few weeks. Specifically 3 in the last 9 days. He has had 2 falls outside. His wife notes that he is having a harder time getting up, falling forward, and needing his walker more frequently. He is still confused, with intermittent episodes of agitation, hallucination, and aggression. He was started on rexulti , however he has not showed any improvement and may be having side effects versus progression of disease. He has started having an aide come in on weekdays during the day. He stays with his children on the weekend. They need to keep doors and windows locked, so he doesn't escape and wander.   His sugars have ranged from 150s-250s at home. He has not had any low blood sugars. He does have neuropathy and is taking gabapentin  600mg  at breakfast and lunch, and 900mg  at dinner. He does become restless some nights and his daughter is concerned about possible restless leg as she has this. He is not urinating as frequently at night since starting the flomax .     ROS See pertinent positives and negatives per HPI.    Objective:     BP 126/70 (BP Location: Left Arm, Patient Position: Sitting, Cuff Size: Normal)   Pulse 78   Temp (!) 97.3 F (36.3 C)   Ht 5' 6 (1.676 m)   Wt 147 lb 9.6 oz (67 kg)   SpO2 99%   BMI 23.82 kg/m  BP Readings from Last 3 Encounters:  06/01/24 126/70  04/29/24 128/70  04/05/24 128/68   Wt Readings from Last 3 Encounters:  06/01/24 147 lb 9.6 oz (67 kg)  04/29/24 149 lb 3.2 oz (67.7 kg)   04/05/24 147 lb 6.4 oz (66.9 kg)      Physical Exam Vitals and nursing note reviewed.  Constitutional:      Appearance: Normal appearance.  HENT:     Head: Normocephalic.  Eyes:     Conjunctiva/sclera: Conjunctivae normal.  Cardiovascular:     Rate and Rhythm: Normal rate and regular rhythm.     Pulses: Normal pulses.     Heart sounds: Normal heart sounds.  Pulmonary:     Effort: Pulmonary effort is normal.     Breath sounds: Normal breath sounds.  Musculoskeletal:     Cervical back: Normal range of motion.  Skin:    General: Skin is warm.     Comments: Abrasion under right eye, left forearm   Neurological:     General: No focal deficit present.     Mental Status: He is alert. Mental status is at baseline. He is disoriented.     Motor: Weakness present.  Psychiatric:        Mood and Affect: Mood normal. Affect is flat.        Behavior: Behavior normal.        Thought Content: Thought content normal.        Judgment: Judgment is impulsive.       Assessment & Plan:   Problem List  Items Addressed This Visit       Endocrine   Diabetes mellitus without complication (HCC)   Chronic, stable. A1c today 7.6%. Continue Januvia  100 mg once daily and Metformin  1000 mg twice daily. He follows regularly with ophthalmology.       Relevant Orders   POCT glycosylated hemoglobin (Hb A1C) (Completed)   Basic metabolic panel with GFR   Iron   Ferritin   Vitamin B12   CBC     Nervous and Auditory   Dementia (HCC) - Primary   He experiences dementia with hallucinations, delusions, and aggression. Rexulti  2 mg has been ineffective and he has started having more trouble with ambulation and weakness. Will taper off this, 1.5mg  daily for 1 week, then 1mg  daily for 1 week, then 0.5mg  daily for 1 week, then stop. He now has an aide at home 5 days a week and stays with his children on the weekend. Will order home health PT for evaluation with increased weakness. He has had side effects  or no improvement with aricept, abilify, seroquel. He is following with neurology as well and has a psychiatry appointment pending.       Relevant Medications   Brexpiprazole  (REXULTI ) 0.5 MG TABS   Other Relevant Orders   Iron   Ferritin   Vitamin B12     Other   Long term current use of oral hypoglycemic drug   Continue januvia  100mg  daily and metformin  1,000mg  BID.       Gait instability   Multiple falls recently. Recommend he use a walker and order home health PT for evaluation and strength training.       Relevant Orders   Ambulatory referral to Home Health   Nocturia   Symptoms have improved with flomax  0.4mg  daily. Continue this medication.       Fall   He has experienced 3 falls in the last 2 weeks and more gait instability. He does have an abrasion to his left forearm and under right eye. Continue washing with soap and water and applying thin layer of neosporin. No signs of infection. Will order home health PT to evaluate. Encouraged him to use his walker.       Relevant Orders   Ambulatory referral to Home Health   Other Visit Diagnoses       Immunization due       Flu vaccine given today   Relevant Orders   Flu vaccine HIGH DOSE PF(Fluzone Trivalent) (Completed)       Return if symptoms worsen or fail to improve.    Tinnie DELENA Harada, NP

## 2024-06-01 NOTE — Assessment & Plan Note (Signed)
 He has experienced 3 falls in the last 2 weeks and more gait instability. He does have an abrasion to his left forearm and under right eye. Continue washing with soap and water and applying thin layer of neosporin. No signs of infection. Will order home health PT to evaluate. Encouraged him to use his walker.

## 2024-06-01 NOTE — Assessment & Plan Note (Signed)
 Chronic, stable. A1c today 7.6%. Continue Januvia  100 mg once daily and Metformin  1000 mg twice daily. He follows regularly with ophthalmology.

## 2024-06-01 NOTE — Assessment & Plan Note (Signed)
 Multiple falls recently. Recommend he use a walker and order home health PT for evaluation and strength training.

## 2024-06-01 NOTE — Patient Instructions (Signed)
 It was great to see you!  We are checking your labs today and will let you know the results via mychart/phone.   Decrease rexulti  to 1.5mg  daily for 1 week, then 1 mg daily for 1 week, then 0.5mg  daily, then stop  I have ordered home health, they will call to schedule   Let's follow-up at your next scheduled visit   If a referral was placed today, you will be contacted for an appointment. Please note that routine referrals can sometimes take up to 3-4 weeks to process. Please call our office if you haven't heard anything after this time frame.  Take care,  Tinnie Harada, NP

## 2024-06-01 NOTE — Assessment & Plan Note (Signed)
 He experiences dementia with hallucinations, delusions, and aggression. Rexulti  2 mg has been ineffective and he has started having more trouble with ambulation and weakness. Will taper off this, 1.5mg  daily for 1 week, then 1mg  daily for 1 week, then 0.5mg  daily for 1 week, then stop. He now has an aide at home 5 days a week and stays with his children on the weekend. Will order home health PT for evaluation with increased weakness. He has had side effects or no improvement with aricept, abilify, seroquel. He is following with neurology as well and has a psychiatry appointment pending.

## 2024-06-01 NOTE — Telephone Encounter (Signed)
 Can we get a prior auth on REXULTI 0.5 MG TABLET ?

## 2024-06-01 NOTE — Assessment & Plan Note (Signed)
 Symptoms have improved with flomax  0.4mg  daily. Continue this medication.

## 2024-06-01 NOTE — Assessment & Plan Note (Signed)
 Continue januvia 100mg  daily and metformin 1,000mg  BID.

## 2024-06-02 ENCOUNTER — Other Ambulatory Visit (HOSPITAL_COMMUNITY): Payer: Self-pay

## 2024-06-02 ENCOUNTER — Telehealth: Payer: Self-pay

## 2024-06-02 ENCOUNTER — Telehealth: Payer: Self-pay | Admitting: Nurse Practitioner

## 2024-06-02 ENCOUNTER — Ambulatory Visit: Payer: Self-pay | Admitting: Nurse Practitioner

## 2024-06-02 NOTE — Telephone Encounter (Signed)
 Pharmacy Patient Advocate Encounter   Received notification from RX Request Messages that prior authorization for Rexulti  0.5mg  is required/requested.   Insurance verification completed.   The patient is insured through [place] .   Per test claim: PA required; PA submitted to above mentioned insurance via Latent Key/confirmation #/EOC [id number]  Status is pending

## 2024-06-02 NOTE — Telephone Encounter (Signed)
Pending Prior auth

## 2024-06-02 NOTE — Telephone Encounter (Signed)
 Copied from CRM [id number]. Topic: Clinical - Prescription Issue >> Jun 02, 2024  3:54 PM Henretta I wrote: Reason for CRM: Patients wife called because she is trying to get husbands prescription for  Brexpiprazole  (REXULTI ) 0.5 MG TABS [facility] but they stated that mylene would like to know if that drug can be substituted with another prescription. Patients wife would like for someone to [facility] on his behalf about medication.   [id number] [address], [place] - [address] [place] [address] Phone: [phone] Fax: [phone] Hours: Not open 24 hours

## 2024-06-03 ENCOUNTER — Other Ambulatory Visit: Payer: Self-pay | Admitting: Nurse Practitioner

## 2024-06-03 ENCOUNTER — Encounter: Payer: Self-pay | Admitting: Nurse Practitioner

## 2024-06-03 ENCOUNTER — Other Ambulatory Visit (HOSPITAL_COMMUNITY): Payer: Self-pay

## 2024-06-03 NOTE — Telephone Encounter (Signed)
 Pharmacy Patient Advocate Encounter  Received notification from [employer] that Prior Authorization for REXULTI  0.5MG   has been APPROVED from 1.1.25 to 12.31.25. Ran test claim, Copay is $0.00. This test claim was processed through [facility]- copay amounts may vary at other pharmacies due to pharmacy/plan contracts, or as the patient moves through the different stages of their insurance plan.   PA #/Case ID/Reference #: [id number]   I have called the pts plan to verify cost of 0.00. reference [id number]. The pts pharmacy has been contacted as well and the Rx has to be ordered and the pt can plan to pickup tomorrow/Friday

## 2024-06-03 NOTE — Telephone Encounter (Signed)
 Left message to return call

## 2024-06-03 NOTE — Telephone Encounter (Signed)
 Requesting: TAMSULOSIN  HCL 0.4 MG CAPSULE  Last Visit: 06/01/2024 Next Visit: 06/07/2024 Last Refill: 04/29/2024  Please Advise

## 2024-06-03 NOTE — Telephone Encounter (Signed)
 Noted. Will send patient's wife a Clinical cytogeneticist message.

## 2024-06-04 ENCOUNTER — Encounter: Payer: Self-pay | Admitting: Nurse Practitioner

## 2024-06-04 NOTE — Telephone Encounter (Signed)
 Message sent to patient's wife via Mychart.

## 2024-06-07 ENCOUNTER — Ambulatory Visit: Admitting: Nurse Practitioner

## 2024-06-07 DIAGNOSIS — F02C11 Dementia in other diseases classified elsewhere, severe, with agitation: Secondary | ICD-10-CM | POA: Diagnosis not present

## 2024-06-07 DIAGNOSIS — E785 Hyperlipidemia, unspecified: Secondary | ICD-10-CM | POA: Diagnosis not present

## 2024-06-07 DIAGNOSIS — R351 Nocturia: Secondary | ICD-10-CM | POA: Diagnosis not present

## 2024-06-07 DIAGNOSIS — M545 Low back pain, unspecified: Secondary | ICD-10-CM | POA: Diagnosis not present

## 2024-06-07 DIAGNOSIS — G301 Alzheimer's disease with late onset: Secondary | ICD-10-CM | POA: Diagnosis not present

## 2024-06-07 DIAGNOSIS — E1136 Type 2 diabetes mellitus with diabetic cataract: Secondary | ICD-10-CM | POA: Diagnosis not present

## 2024-06-07 DIAGNOSIS — G8929 Other chronic pain: Secondary | ICD-10-CM | POA: Diagnosis not present

## 2024-06-07 DIAGNOSIS — F02C2 Dementia in other diseases classified elsewhere, severe, with psychotic disturbance: Secondary | ICD-10-CM | POA: Diagnosis not present

## 2024-06-07 DIAGNOSIS — G4700 Insomnia, unspecified: Secondary | ICD-10-CM | POA: Diagnosis not present

## 2024-06-10 DIAGNOSIS — H6121 Impacted cerumen, right ear: Secondary | ICD-10-CM | POA: Diagnosis not present

## 2024-06-10 DIAGNOSIS — G309 Alzheimer's disease, unspecified: Secondary | ICD-10-CM | POA: Diagnosis not present

## 2024-06-10 DIAGNOSIS — F0280 Dementia in other diseases classified elsewhere without behavioral disturbance: Secondary | ICD-10-CM | POA: Diagnosis not present

## 2024-06-10 DIAGNOSIS — H93293 Other abnormal auditory perceptions, bilateral: Secondary | ICD-10-CM | POA: Diagnosis not present

## 2024-06-12 DIAGNOSIS — E1136 Type 2 diabetes mellitus with diabetic cataract: Secondary | ICD-10-CM | POA: Diagnosis not present

## 2024-06-12 DIAGNOSIS — E785 Hyperlipidemia, unspecified: Secondary | ICD-10-CM | POA: Diagnosis not present

## 2024-06-12 DIAGNOSIS — F02C2 Dementia in other diseases classified elsewhere, severe, with psychotic disturbance: Secondary | ICD-10-CM | POA: Diagnosis not present

## 2024-06-12 DIAGNOSIS — G8929 Other chronic pain: Secondary | ICD-10-CM | POA: Diagnosis not present

## 2024-06-12 DIAGNOSIS — F02C11 Dementia in other diseases classified elsewhere, severe, with agitation: Secondary | ICD-10-CM | POA: Diagnosis not present

## 2024-06-12 DIAGNOSIS — R351 Nocturia: Secondary | ICD-10-CM | POA: Diagnosis not present

## 2024-06-12 DIAGNOSIS — M545 Low back pain, unspecified: Secondary | ICD-10-CM | POA: Diagnosis not present

## 2024-06-12 DIAGNOSIS — G301 Alzheimer's disease with late onset: Secondary | ICD-10-CM | POA: Diagnosis not present

## 2024-06-12 DIAGNOSIS — G4700 Insomnia, unspecified: Secondary | ICD-10-CM | POA: Diagnosis not present

## 2024-06-14 ENCOUNTER — Ambulatory Visit: Payer: Self-pay | Admitting: *Deleted

## 2024-06-14 NOTE — Telephone Encounter (Signed)
 FYI Only or Action Required?: FYI only for provider.  Patient was last seen in primary care on 06/01/2024 by Nedra Tinnie LABOR, NP.  Called Nurse Triage reporting heartrate decrease.  Symptoms began several days ago.  Interventions attempted: Nothing.  Symptoms are: gradually improving.  Triage Disposition: See HCP Within 4 Hours (Or PCP Triage)  Patient/caregiver understands and will follow disposition?: No, refuses disposition   Reason for Disposition  [1] Heart beating very slowly (e.g., < 50 / minute) AND [2] NOT present now  (Exception: Athlete and heart rate is normal for caller.)    Wife reports this is patient normal- does not think appointment needed- advised I would alert provider for any recommendations/comments.  Answer Assessment - Initial Assessment Questions 1. DESCRIPTION: Please describe your heart rate or heartbeat that you are having (e.g., fast/slow, regular/irregular, skipped or extra beats, palpitations)     Low heart rate this weekend 2. ONSET: When did it start? (e.g., minutes, hours, days)      Saturday night- HR- 46 3. DURATION: How long does it last (e.g., seconds, minutes, hours)     Just Saturday 4. PATTERN Does it come and go, or has it been constant since it started?  Does it get worse with exertion?   Are you feeling it now?     Patient's wife reports heart rate did go up- Sunday/Monday to patient norm which is in 50's  6. HEART RATE: Can you tell me your heart rate? How many beats in 15 seconds?  Note: Not all patients can do this.       Saturday: 140/60, P46, alert, asymptomatic     Sunday : 127/85 , P 50 and above( one check at 48- but all other above 50) 7. RECURRENT SYMPTOM: Have you ever had this before? If Yes, ask: When was the last time? and What happened that time?      yes 8. CAUSE: What do you think is causing the palpitations?     Wife reports she has given 2 Suvorexant (BELSOMRA) 5 MG TABS which she normally  does not- she believes this caused the low HR  10. OTHER SYMPTOMS: Do you have any other symptoms? (e.g., dizziness, chest pain, sweating, difficulty breathing)       No other symptoms  Protocols used: Heart Rate and Heartbeat Questions-A-AH   Copied from CRM [id number]. Topic: Clinical - Red Word Triage >> Jun 14, 2024  3:58 PM Mesmerise C wrote: [facility] that prompted transfer to Nurse Triage: Sonny from [facility] stated patient's heartrate over the weekend was in the 40s andhis BP 140/60 believes it was on Saturday stated he was asymptomatic and was aware of what was going on

## 2024-06-15 ENCOUNTER — Telehealth: Payer: Self-pay | Admitting: Nurse Practitioner

## 2024-06-15 NOTE — Telephone Encounter (Signed)
 Patient dropped off document Home Health Certificate (Order ID [id number]), to be filled out by provider. Patient requested to send it back via Fax within 7-days. Document is located in providers tray at front office.Please advise at [phone]  Came through by fax and I put in the dr box

## 2024-06-16 DIAGNOSIS — E1136 Type 2 diabetes mellitus with diabetic cataract: Secondary | ICD-10-CM | POA: Diagnosis not present

## 2024-06-16 NOTE — Telephone Encounter (Signed)
 I called and spoke with patient's wife and notified her of below message per Tinnie and she is monitoring it. I also notified her that paperwork was received and that Tinnie is out of the office till Monday and she is ok with her to fill out when she comes back to office.

## 2024-06-21 DIAGNOSIS — R296 Repeated falls: Secondary | ICD-10-CM

## 2024-06-21 DIAGNOSIS — H409 Unspecified glaucoma: Secondary | ICD-10-CM

## 2024-06-21 DIAGNOSIS — E785 Hyperlipidemia, unspecified: Secondary | ICD-10-CM | POA: Diagnosis not present

## 2024-06-21 DIAGNOSIS — R351 Nocturia: Secondary | ICD-10-CM | POA: Diagnosis not present

## 2024-06-21 DIAGNOSIS — G8929 Other chronic pain: Secondary | ICD-10-CM | POA: Diagnosis not present

## 2024-06-21 DIAGNOSIS — F02C2 Dementia in other diseases classified elsewhere, severe, with psychotic disturbance: Secondary | ICD-10-CM | POA: Diagnosis not present

## 2024-06-21 DIAGNOSIS — K219 Gastro-esophageal reflux disease without esophagitis: Secondary | ICD-10-CM

## 2024-06-21 DIAGNOSIS — E1136 Type 2 diabetes mellitus with diabetic cataract: Secondary | ICD-10-CM | POA: Diagnosis not present

## 2024-06-21 DIAGNOSIS — G301 Alzheimer's disease with late onset: Secondary | ICD-10-CM | POA: Diagnosis not present

## 2024-06-21 DIAGNOSIS — M545 Low back pain, unspecified: Secondary | ICD-10-CM | POA: Diagnosis not present

## 2024-06-21 DIAGNOSIS — G4700 Insomnia, unspecified: Secondary | ICD-10-CM | POA: Diagnosis not present

## 2024-06-21 DIAGNOSIS — F02C11 Dementia in other diseases classified elsewhere, severe, with agitation: Secondary | ICD-10-CM | POA: Diagnosis not present

## 2024-06-22 ENCOUNTER — Telehealth: Payer: Self-pay

## 2024-06-22 NOTE — Telephone Encounter (Unsigned)
 Copied from CRM [id number]. Topic: Clinical - Medication Question >> Jun 22, 2024  4:28 PM Thersia BROCKS wrote: Reason for CRM: Patient daughter called in regarding a medication question wanted to know if theses are able to be opened up   tamsulosin  (FLOMAX ) 0.4 MG CAPS capsule DULoxetine  (CYMBALTA ) 60 MG capsule  [id number]

## 2024-06-23 ENCOUNTER — Encounter: Payer: Self-pay | Admitting: Nurse Practitioner

## 2024-06-23 DIAGNOSIS — M545 Low back pain, unspecified: Secondary | ICD-10-CM | POA: Diagnosis not present

## 2024-06-23 DIAGNOSIS — F02C11 Dementia in other diseases classified elsewhere, severe, with agitation: Secondary | ICD-10-CM | POA: Diagnosis not present

## 2024-06-23 DIAGNOSIS — R351 Nocturia: Secondary | ICD-10-CM | POA: Diagnosis not present

## 2024-06-23 DIAGNOSIS — G8929 Other chronic pain: Secondary | ICD-10-CM | POA: Diagnosis not present

## 2024-06-23 DIAGNOSIS — E1136 Type 2 diabetes mellitus with diabetic cataract: Secondary | ICD-10-CM | POA: Diagnosis not present

## 2024-06-23 DIAGNOSIS — G4700 Insomnia, unspecified: Secondary | ICD-10-CM | POA: Diagnosis not present

## 2024-06-23 DIAGNOSIS — G301 Alzheimer's disease with late onset: Secondary | ICD-10-CM | POA: Diagnosis not present

## 2024-06-23 DIAGNOSIS — E785 Hyperlipidemia, unspecified: Secondary | ICD-10-CM | POA: Diagnosis not present

## 2024-06-23 DIAGNOSIS — F02C2 Dementia in other diseases classified elsewhere, severe, with psychotic disturbance: Secondary | ICD-10-CM | POA: Diagnosis not present

## 2024-06-23 NOTE — Telephone Encounter (Signed)
 I called and spoke with patient's wife and notified her of previous message that was responded back via mychart and notified her of below message and she said that per the [facility] clinic these medications can not be opened and please tell Lauren to recheck this. Patient's wife will not open medication and follow up on Monday at [facility] appt.

## 2024-06-24 DIAGNOSIS — H903 Sensorineural hearing loss, bilateral: Secondary | ICD-10-CM | POA: Diagnosis not present

## 2024-06-24 DIAGNOSIS — G309 Alzheimer's disease, unspecified: Secondary | ICD-10-CM | POA: Diagnosis not present

## 2024-06-24 DIAGNOSIS — F0280 Dementia in other diseases classified elsewhere without behavioral disturbance: Secondary | ICD-10-CM | POA: Diagnosis not present

## 2024-06-28 ENCOUNTER — Encounter: Payer: Self-pay | Admitting: Nurse Practitioner

## 2024-06-28 ENCOUNTER — Ambulatory Visit: Payer: Self-pay | Admitting: Nurse Practitioner

## 2024-06-28 ENCOUNTER — Ambulatory Visit: Admitting: Nurse Practitioner

## 2024-06-28 VITALS — BP 122/58 | Temp 97.4°F | Ht 66.0 in | Wt 147.2 lb

## 2024-06-28 DIAGNOSIS — E559 Vitamin D deficiency, unspecified: Secondary | ICD-10-CM

## 2024-06-28 DIAGNOSIS — R600 Localized edema: Secondary | ICD-10-CM

## 2024-06-28 DIAGNOSIS — G301 Alzheimer's disease with late onset: Secondary | ICD-10-CM | POA: Diagnosis not present

## 2024-06-28 DIAGNOSIS — Z23 Encounter for immunization: Secondary | ICD-10-CM

## 2024-06-28 DIAGNOSIS — E1140 Type 2 diabetes mellitus with diabetic neuropathy, unspecified: Secondary | ICD-10-CM | POA: Diagnosis not present

## 2024-06-28 DIAGNOSIS — F02C2 Dementia in other diseases classified elsewhere, severe, with psychotic disturbance: Secondary | ICD-10-CM

## 2024-06-28 MED ORDER — COVID-19 MRNA VACC (MODERNA) 50 MCG/0.5ML IM SUSY: 0.5000 mL | PREFILLED_SYRINGE | Freq: Once | INTRAMUSCULAR | 0 refills | Status: AC

## 2024-06-28 NOTE — Progress Notes (Signed)
 EKG interpreted by me on 06/28/24 showed sinus bradycardia with a heart rate of 55. No ST or T wave changes

## 2024-06-28 NOTE — Assessment & Plan Note (Signed)
 Chronic insomnia with a disrupted sleep-wake cycle persists. Belsomra is less effective, and clonazepam  increases sedation and fall risk. Gabapentin , melatonin, and magnesium are potential aids. CBD is not recommended due to fall risk. Increase gabapentin  by 300 mg at bedtime along with taking 600mg  at breakfast and lunch and 900mg  at dinner. Consider melatonin 3-5 mg and magnesium glycinate or oxide at bedtime.

## 2024-06-28 NOTE — Patient Instructions (Addendum)
 It was great to see you!  Increase gabapentin  to 2 in the morning, 2 at lunch, 3 at dinner, and 1 at bedtime   Keep taking other night time medications   You can also try 3-5mg  of melatonin at bedtime or magnesium glycinate or oxide to help with sleep   We are checking your labs today and will let you know the results via mychart/phone.   Let's follow-up in 1 months, sooner if you have concerns.  If a referral was placed today, you will be contacted for an appointment. Please note that routine referrals can sometimes take up to 3-4 weeks to process. Please call our office if you haven't heard anything after this time frame.  Take care,  Tinnie Harada, NP

## 2024-06-28 NOTE — Progress Notes (Signed)
 Established Patient Office Visit  Subjective   Patient ID: Jeffery Stewart, male    DOB: 08/06/1936  Age: 88 y.o. 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  History of Present Illness   Jeffery Stewart is an 88 year old male with insomnia and dementia who presents with sleep disturbances and medication management. He is accompanied by his wife and daughter who provide the history.   He experiences significant sleep disturbances with difficulty maintaining sleep and frequent awakenings within an hour of falling asleep. Despite a consistent nighttime routine, he struggles to sleep through the night and sometimes stays awake for extended periods. Daytime napping is inconsistent, with excessive sleep on some days following poor nights. He takes Belsomra and clonazepam  for sleep, with clonazepam  at 1 mg and Belsomra as needed, sometimes repeated at 4 AM. Initially effective, these medications no longer provide consistent relief. Episodes of increased nighttime activity occur, where he moves around the house independently and quietly.  Dementia complicates his sleep patterns. At night, he navigates the house and performs tasks independently, but during the day, he struggles with orientation and requires assistance. He is more active and coordinated at night compared to daytime.  He experiences swelling in his feet, persisting for several weeks, relieved by pressure, with no associated pain. No recent medication changes could account for this symptom. No shortness of breath, chest pain, or recent falls. Gabapentin  for neuropathy has alleviated burning or stinging in his feet, but swelling persists. Blood sugar levels have improved since tapering off  Rexulti , with readings consistently below 178 mg/dL. Mobility has improved with less stiffness noted.        ROS See pertinent positives and negatives per HPI.    Objective:     BP (!) 122/58 (BP Location: Left Arm, Patient Position: Sitting, Cuff Size: Normal)   Temp (!) 97.4 F (36.3 C)   Ht 5' 6 (1.676 m)   Wt 147 lb 3.2 oz (66.8 kg)   BMI 23.76 kg/m    Physical Exam Vitals and nursing note reviewed.  Constitutional:      Appearance: Normal appearance.  HENT:     Head: Normocephalic.  Eyes:     Conjunctiva/sclera: Conjunctivae normal.  Cardiovascular:     Rate and Rhythm: Normal rate and regular rhythm.     Pulses: Normal pulses.     Heart sounds: Normal heart sounds.  Pulmonary:     Effort: Pulmonary effort is normal.     Breath sounds: Normal breath sounds.  Musculoskeletal:     Cervical back: Normal range of motion.     Right lower leg: Edema (3+ pitting) present.     Left lower leg: Edema (3+ pitting) present.     Comments: Shuffling gait  Skin:    General: Skin is warm.  Neurological:     General: No focal deficit present.     Mental Status: He is alert. Mental status is at baseline. He is disoriented.  Psychiatric:        Mood and Affect: Mood normal.        Behavior: Behavior normal.        Thought Content: Thought content normal.        Judgment: Judgment normal.  Assessment & Plan:   Problem List Items Addressed This Visit       Endocrine   Type 2 diabetes mellitus with diabetic neuropathy, unspecified (HCC)   Sugars have improved at home. Chronic peripheral neuropathy is effectively managed with the current gabapentin  regimen. No recent foot pain reported. Continue current gabapentin  regimen: 2 capsules in the morning, 2 at lunch, 3 at dinner, and start 1 at bedtime as noted above. They say when he starts to wake up at night, it starts with him moving his legs and rubbing his feet against the sheets.         Nervous and Auditory    Dementia (HCC) - Primary   Chronic insomnia with a disrupted sleep-wake cycle persists. Belsomra is less effective, and clonazepam  increases sedation and fall risk. Gabapentin , melatonin, and magnesium are potential aids. CBD is not recommended due to fall risk. Increase gabapentin  by 300 mg at bedtime along with taking 600mg  at breakfast and lunch and 900mg  at dinner. Consider melatonin 3-5 mg and magnesium glycinate or oxide at bedtime.        Other   Bilateral leg edema   New bilateral lower extremity edema is present without recent medication changes or heart failure symptoms. Increased walking may contribute. Order BNP test. Recommend wearing compression socks during the day. EKG done shows sinus bradycardia with a heart rate of 55, no ST or T wave changes. No abnormal/early beats noted on EKG.       Relevant Orders   B Nat Peptide   EKG 12-Lead (Completed)   Vitamin D insufficiency   Check vitamin D today and treat based on results.       Relevant Orders   VITAMIN D 25 Hydroxy (Vit-D Deficiency, Fractures)   Other Visit Diagnoses       Immunization due       Covid 19 vaccine booster RX printed to take to the pharmacy.   Relevant Medications   COVID-19 mRNA vaccine (SPIKEVAX) syringe       Return in about 4 weeks (around 07/26/2024) for dementia, swelling.    Tinnie DELENA Harada, NP

## 2024-06-28 NOTE — Assessment & Plan Note (Signed)
Check vitamin D today and treat based on results.  °

## 2024-06-28 NOTE — Assessment & Plan Note (Signed)
 Sugars have improved at home. Chronic peripheral neuropathy is effectively managed with the current gabapentin  regimen. No recent foot pain reported. Continue current gabapentin  regimen: 2 capsules in the morning, 2 at lunch, 3 at dinner, and start 1 at bedtime as noted above. They say when he starts to wake up at night, it starts with him moving his legs and rubbing his feet against the sheets.

## 2024-06-28 NOTE — Assessment & Plan Note (Signed)
 New bilateral lower extremity edema is present without recent medication changes or heart failure symptoms. Increased walking may contribute. Order BNP test. Recommend wearing compression socks during the day. EKG done shows sinus bradycardia with a heart rate of 55, no ST or T wave changes. No abnormal/early beats noted on EKG.

## 2024-06-29 LAB — VITAMIN D 25 HYDROXY (VIT D DEFICIENCY, FRACTURES): VITD: 27.6 ng/mL — ABNORMAL LOW (ref 30.00–100.00)

## 2024-06-29 LAB — BRAIN NATRIURETIC PEPTIDE: Pro B Natriuretic peptide (BNP): 44 pg/mL (ref 0.0–100.0)

## 2024-06-30 ENCOUNTER — Encounter: Payer: Self-pay | Admitting: Nurse Practitioner

## 2024-07-05 ENCOUNTER — Encounter: Payer: Self-pay | Admitting: Nurse Practitioner

## 2024-07-05 DIAGNOSIS — E1136 Type 2 diabetes mellitus with diabetic cataract: Secondary | ICD-10-CM | POA: Diagnosis not present

## 2024-07-05 DIAGNOSIS — G301 Alzheimer's disease with late onset: Secondary | ICD-10-CM | POA: Diagnosis not present

## 2024-07-05 DIAGNOSIS — G4700 Insomnia, unspecified: Secondary | ICD-10-CM | POA: Diagnosis not present

## 2024-07-05 DIAGNOSIS — R351 Nocturia: Secondary | ICD-10-CM | POA: Diagnosis not present

## 2024-07-05 DIAGNOSIS — F02C11 Dementia in other diseases classified elsewhere, severe, with agitation: Secondary | ICD-10-CM | POA: Diagnosis not present

## 2024-07-05 DIAGNOSIS — G8929 Other chronic pain: Secondary | ICD-10-CM | POA: Diagnosis not present

## 2024-07-05 DIAGNOSIS — F02C2 Dementia in other diseases classified elsewhere, severe, with psychotic disturbance: Secondary | ICD-10-CM | POA: Diagnosis not present

## 2024-07-05 DIAGNOSIS — E785 Hyperlipidemia, unspecified: Secondary | ICD-10-CM | POA: Diagnosis not present

## 2024-07-08 ENCOUNTER — Ambulatory Visit (HOSPITAL_BASED_OUTPATIENT_CLINIC_OR_DEPARTMENT_OTHER): Admitting: Nurse Practitioner

## 2024-07-08 DIAGNOSIS — F02C11 Dementia in other diseases classified elsewhere, severe, with agitation: Secondary | ICD-10-CM | POA: Diagnosis not present

## 2024-07-08 DIAGNOSIS — G4700 Insomnia, unspecified: Secondary | ICD-10-CM | POA: Diagnosis not present

## 2024-07-08 DIAGNOSIS — G301 Alzheimer's disease with late onset: Secondary | ICD-10-CM | POA: Diagnosis not present

## 2024-07-08 DIAGNOSIS — E1136 Type 2 diabetes mellitus with diabetic cataract: Secondary | ICD-10-CM | POA: Diagnosis not present

## 2024-07-08 DIAGNOSIS — F02C2 Dementia in other diseases classified elsewhere, severe, with psychotic disturbance: Secondary | ICD-10-CM | POA: Diagnosis not present

## 2024-07-08 DIAGNOSIS — M545 Low back pain, unspecified: Secondary | ICD-10-CM | POA: Diagnosis not present

## 2024-07-08 DIAGNOSIS — E785 Hyperlipidemia, unspecified: Secondary | ICD-10-CM | POA: Diagnosis not present

## 2024-07-08 DIAGNOSIS — R351 Nocturia: Secondary | ICD-10-CM | POA: Diagnosis not present

## 2024-07-08 DIAGNOSIS — G8929 Other chronic pain: Secondary | ICD-10-CM | POA: Diagnosis not present

## 2024-07-19 ENCOUNTER — Encounter: Payer: Self-pay | Admitting: Nurse Practitioner

## 2024-07-20 NOTE — Telephone Encounter (Signed)
 Noted. Appointment changed to virtual for Monday.

## 2024-07-25 DIAGNOSIS — N3001 Acute cystitis with hematuria: Secondary | ICD-10-CM | POA: Diagnosis not present

## 2024-07-25 DIAGNOSIS — R300 Dysuria: Secondary | ICD-10-CM | POA: Diagnosis not present

## 2024-07-26 ENCOUNTER — Encounter: Payer: Self-pay | Admitting: Nurse Practitioner

## 2024-07-26 ENCOUNTER — Ambulatory Visit: Admitting: Nurse Practitioner

## 2024-07-26 VITALS — BP 122/64 | HR 52 | Ht 66.0 in | Wt 148.8 lb

## 2024-07-26 DIAGNOSIS — L309 Dermatitis, unspecified: Secondary | ICD-10-CM | POA: Diagnosis not present

## 2024-07-26 DIAGNOSIS — F02C2 Dementia in other diseases classified elsewhere, severe, with psychotic disturbance: Secondary | ICD-10-CM | POA: Diagnosis not present

## 2024-07-26 DIAGNOSIS — R3915 Urgency of urination: Secondary | ICD-10-CM

## 2024-07-26 DIAGNOSIS — G301 Alzheimer's disease with late onset: Secondary | ICD-10-CM

## 2024-07-26 NOTE — Assessment & Plan Note (Signed)
 He recently experienced an episode treated with Keflex for a presumed UTI, with culture results pending. Continue Keflex as prescribed and encourage increased fluid intake. Consider yogurt or probiotics to prevent antibiotic-associated diarrhea, and administer Imodium if diarrhea occurs.

## 2024-07-26 NOTE — Assessment & Plan Note (Signed)
 Chronic, stable. He has been sleeping better since adjusting medications and not going to his daughter's house on the weekend. He has appointment with psychiatry this Wednesday.

## 2024-07-26 NOTE — Patient Instructions (Signed)
 It was great to see you!  Keep area to bottom clean and dry   Start desitin the red area   Keep taking the antibiotic as prescribed and you can start yogurt or probiotic   Start imodium as needed for diarrhea   Let's follow-up in 4-6 weeks  Take care,  Tinnie Harada, NP

## 2024-07-26 NOTE — Progress Notes (Signed)
 Established Patient Office Visit  Subjective   Patient ID: Jeffery Stewart, male    DOB: [birth year]  Age: 88 y.o. 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  History of Present Illness   Jeffery Stewart is an 88 year old male who presents with urinary urgency and incontinence. He is accompanied by Avelina armin Shuck.  He visited urgent care for urinary urgency and incontinence and has taken three doses of Keflex. There is concern about potential diarrhea as a side effect of the antibiotic.  He experiences improved sleep patterns, waking once or twice most nights and returning to sleep. He sleeps approximately eleven hours on many days, with naps during the day.  Pitting edema in both feet is managed with compression stockings. No edema is noted on his ankles or legs.  He has recently tapered off Rexulti  with no recent behavioral issues. An appointment with a psychiatrist is upcoming.  Redness on his bottom due to wearing pads is managed with cornstarch, but is still there. She tries to keep the area clean and dry.   He has access to caregivers and family support, with arrangements for additional help during the week if needed. No falls, near falls, or pain. No issues with compliance or behavioral disturbances.       ROS See pertinent positives and negatives per HPI.    Objective:     BP 122/64 (BP Location: Left Arm, Patient Position: Sitting, Cuff Size: Normal)   Pulse (!) 52   Ht 5' 6 (1.676 m)   Wt 148 lb 12.8 oz (67.5 kg)   SpO2 98%   BMI 24.02 kg/m    Physical Exam Vitals and nursing note reviewed.  Constitutional:      Appearance: Normal appearance.  HENT:     Head:  Normocephalic.  Eyes:     Conjunctiva/sclera: Conjunctivae normal.  Cardiovascular:     Rate and Rhythm: Normal rate and regular rhythm.     Pulses: Normal pulses.     Heart sounds: Normal heart sounds.  Pulmonary:     Effort: Pulmonary effort is normal.     Breath sounds: Normal breath sounds.  Musculoskeletal:     Cervical back: Normal range of motion.  Skin:    General: Skin is warm.  Neurological:     General: No focal deficit present.     Mental Status: He is alert. Mental status is at baseline. He is disoriented.  Psychiatric:        Cognition and Memory: Memory is impaired.        Judgment: Judgment is impulsive.      Assessment & Plan:   Problem List Items Addressed This Visit       Nervous and Auditory   Dementia (HCC) - Primary   Chronic, stable. He has been sleeping better since adjusting medications and not going to his daughter's house on the weekend. He has appointment with psychiatry this Wednesday.         Other   Urinary urgency   He recently experienced an episode treated with Keflex for a presumed UTI, with culture results pending. Continue Keflex as prescribed and encourage increased fluid intake. Consider yogurt or  probiotics to prevent antibiotic-associated diarrhea, and administer Imodium if diarrhea occurs.      Other Visit Diagnoses       Dermatitis       Redness on the buttocks is likely due to pads. Apply nystatin cream or Desitin as a barrier ointment and ensure frequent washing and drying of the area.       Return in about 4 weeks (around 08/23/2024) for 4-6 weeks follow-up.    Tinnie DELENA Harada, NP

## 2024-07-28 ENCOUNTER — Encounter: Payer: Self-pay | Admitting: Nurse Practitioner

## 2024-07-28 DIAGNOSIS — F4324 Adjustment disorder with disturbance of conduct: Secondary | ICD-10-CM | POA: Diagnosis not present

## 2024-07-29 ENCOUNTER — Telehealth: Payer: Self-pay

## 2024-07-29 DIAGNOSIS — M545 Low back pain, unspecified: Secondary | ICD-10-CM | POA: Diagnosis not present

## 2024-07-29 NOTE — Telephone Encounter (Signed)
 Copied from CRM #[id number]. Topic: General - Other >> Jul 29, 2024 12:48 PM China J wrote: Reason for CRM: Vertell is calling from [facility] to let us  know that the patient's wife reached out to them because the patient had gone to the urgent care for UTI testing and was started on an antibiotic. The patient's results came back after he was already taking the medication. The results showed that he was negative for a UTI. Being said, the wife is wanting nursing from [facility] to come by to retest him for a UTI. The patient is asymptomatic at this time. Please call Vertell with an approval or denial for this request.  Vertell: [phone]

## 2024-07-29 NOTE — Telephone Encounter (Signed)
 I called Vertell and left a detailed message giving a verbal ok for patient to  be tested again.

## 2024-08-02 DIAGNOSIS — F02C2 Dementia in other diseases classified elsewhere, severe, with psychotic disturbance: Secondary | ICD-10-CM | POA: Diagnosis not present

## 2024-08-02 DIAGNOSIS — G8929 Other chronic pain: Secondary | ICD-10-CM | POA: Diagnosis not present

## 2024-08-02 DIAGNOSIS — E1136 Type 2 diabetes mellitus with diabetic cataract: Secondary | ICD-10-CM | POA: Diagnosis not present

## 2024-08-02 DIAGNOSIS — E785 Hyperlipidemia, unspecified: Secondary | ICD-10-CM | POA: Diagnosis not present

## 2024-08-02 DIAGNOSIS — G4700 Insomnia, unspecified: Secondary | ICD-10-CM | POA: Diagnosis not present

## 2024-08-02 DIAGNOSIS — G301 Alzheimer's disease with late onset: Secondary | ICD-10-CM | POA: Diagnosis not present

## 2024-08-02 DIAGNOSIS — M545 Low back pain, unspecified: Secondary | ICD-10-CM | POA: Diagnosis not present

## 2024-08-02 DIAGNOSIS — N390 Urinary tract infection, site not specified: Secondary | ICD-10-CM | POA: Diagnosis not present

## 2024-08-02 DIAGNOSIS — R351 Nocturia: Secondary | ICD-10-CM | POA: Diagnosis not present

## 2024-08-02 DIAGNOSIS — F02C11 Dementia in other diseases classified elsewhere, severe, with agitation: Secondary | ICD-10-CM | POA: Diagnosis not present

## 2024-08-04 ENCOUNTER — Encounter: Payer: Self-pay | Admitting: Nurse Practitioner

## 2024-08-11 ENCOUNTER — Encounter: Payer: Self-pay | Admitting: Nurse Practitioner

## 2024-08-23 ENCOUNTER — Encounter: Payer: Self-pay | Admitting: Nurse Practitioner

## 2024-09-06 ENCOUNTER — Emergency Department (HOSPITAL_COMMUNITY)
Admission: EM | Admit: 2024-09-06 | Discharge: 2024-09-06 | Disposition: A | Discharge status: Home / Self Care | Attending: Emergency Medicine | Admitting: Emergency Medicine

## 2024-09-06 ENCOUNTER — Other Ambulatory Visit: Payer: Self-pay

## 2024-09-06 ENCOUNTER — Encounter (HOSPITAL_COMMUNITY): Payer: Self-pay | Admitting: Emergency Medicine

## 2024-09-06 ENCOUNTER — Emergency Department (HOSPITAL_COMMUNITY)

## 2024-09-06 DIAGNOSIS — F0390 Unspecified dementia without behavioral disturbance: Secondary | ICD-10-CM | POA: Insufficient documentation

## 2024-09-06 DIAGNOSIS — W19XXXA Unspecified fall, initial encounter: Secondary | ICD-10-CM | POA: Diagnosis not present

## 2024-09-06 DIAGNOSIS — W01198A Fall on same level from slipping, tripping and stumbling with subsequent striking against other object, initial encounter: Secondary | ICD-10-CM | POA: Insufficient documentation

## 2024-09-06 DIAGNOSIS — S42292A Other displaced fracture of upper end of left humerus, initial encounter for closed fracture: Secondary | ICD-10-CM

## 2024-09-06 DIAGNOSIS — E119 Type 2 diabetes mellitus without complications: Secondary | ICD-10-CM | POA: Insufficient documentation

## 2024-09-06 DIAGNOSIS — Z7984 Long term (current) use of oral hypoglycemic drugs: Secondary | ICD-10-CM | POA: Insufficient documentation

## 2024-09-06 MED ORDER — OXYCODONE-ACETAMINOPHEN 5-325 MG PO TABS
1.0000 | ORAL_TABLET | Freq: Once | ORAL | Status: AC
  Administered 2024-09-06: 1 via ORAL
  Filled 2024-09-06: qty 1

## 2024-09-06 MED ORDER — MORPHINE SULFATE (PF) 4 MG/ML IV SOLN
4.0000 mg | Freq: Once | INTRAVENOUS | Status: AC
  Administered 2024-09-06: 4 mg via INTRAMUSCULAR
  Filled 2024-09-06: qty 1

## 2024-09-06 MED ORDER — OXYCODONE-ACETAMINOPHEN 5-325 MG PO TABS: 1.0000 | ORAL_TABLET | ORAL | 0 refills | Status: DC | PRN

## 2024-09-06 NOTE — ED Notes (Signed)
 Pt to XRAY

## 2024-09-06 NOTE — Medical Student Note (Signed)
 [facility]-EMERGENCY DEPT Provider Student Note For educational purposes for Medical, PA and NP students only and not part of the legal medical record.   CSN: [id number] Arrival date & time: 09/06/24  0640      History   Chief Complaint Chief Complaint  Patient presents with   Fall   Arm Pain    HPI Jeffery Stewart is a 88 y.o. male.  88 year old male with a past medical history of T2DM, dementia, glaucoma and GERD presents to the ED via EMS s/p witnessed fall at home. Patient is not on anticoagulants. History limited secondary to dementia. Wife says she witnessed fall at home 1 hour prior to arrival, saying that the patient tripped at home and fell directly on to the left shoulder. Denies LOC or head contact. Patient is c/o L shoulder and L elbow pain. Denies any other complaints. Tylenol  given at home for pain management but has not provided significant relief.   The history is provided by the patient, the EMS personnel and the spouse.  Fall This is a new problem. The current episode started 1 to 2 hours ago. Pertinent negatives include no chest pain, no abdominal pain, no headaches and no shortness of breath. He has tried acetaminophen  for the symptoms.  Arm Pain Pertinent negatives include no chest pain, no abdominal pain, no headaches and no shortness of breath.    Past Medical History:  Diagnosis Date   Arthritis    Dementia (HCC) 2012   Diabetes mellitus without complication (HCC)    GERD (gastroesophageal reflux disease)    Glaucoma    History of prostate cancer    Neuropathy    Post-polio syndrome (HCC)    possible weakned intercostal muscles, fatigues easily    Patient Active Problem List   Diagnosis Date Noted   Urinary urgency 07/26/2024   Bilateral leg edema 06/28/2024   Vitamin D  insufficiency 06/28/2024   Fall 06/01/2024   Gait instability 04/29/2024   Nocturia 04/29/2024   Other insomnia 04/29/2024   Routine general medical examination at a health care  facility 04/05/2024   Long term current use of oral hypoglycemic drug 03/03/2024   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 Authorizing Provider  Accu-Chek FastClix Lancets MISC USE 1  TO CHECK GLUCOSE ONCE DAILY 11/20/20   [provider]  acetaminophen  (TYLENOL ) 500 MG tablet Take 500 mg by mouth daily as needed for mild pain or moderate pain. Take as needed for back/neck pain.    [provider]  b complex vitamins capsule Take 1 capsule by mouth daily.    [provider]  Blood Glucose Monitoring Suppl (GLUCOCOM BLOOD GLUCOSE MONITOR) DEVI 1 Device by Misc.(Non-Drug; Combo Route) route 4 (four) times daily. 11/28/20   [provider]  clonazePAM  (KLONOPIN ) 1 MG tablet Take 1-2 tablets (1-2 mg total) by mouth  at bedtime. 04/29/24   McElwee, Lauren A, NP  cycloSPORINE  (RESTASIS ) 0.05 % ophthalmic emulsion Place 1 drop into both eyes 2 (two) times daily.    [provider]  DULoxetine  (CYMBALTA ) 60 MG capsule Take 60 mg by mouth daily.    [provider]  famotidine  (PEPCID ) 20 MG tablet Take 20 mg by mouth 2 (two) times daily.    [provider]  gabapentin  (NEURONTIN ) 300 MG capsule Take 300 mg by mouth daily at 6 (six) AM. Patients take 7 capsules a day.    [provider]  glucose blood (ACCU-CHEK GUIDE TEST) test strip Use as instructed 03/03/24    McElwee, Lauren A, NP  lidocaine (LIDODERM) 5 % Place 1 patch onto the skin daily. Remove & Discard patch within 12 hours or as directed by MD    [provider]  metFORMIN  (GLUCOPHAGE ) 1000 MG tablet Take 1 tablet (1,000 mg total) by mouth 2 (two) times daily with a meal. 03/03/24   McElwee, Lauren A, NP  nitroGLYCERIN  (NITROSTAT ) 0.4 MG SL tablet Place 1 tablet (0.4 mg total) under the tongue every 5 (five) minutes x 3 doses as needed for chest pain. 02/11/23   Samtani, Jai-Gurmukh, MD  sitaGLIPtin  (JANUVIA ) 100 MG tablet Take 1 tablet (100 mg total) by mouth daily. 03/03/24   McElwee, Lauren A, NP  Suvorexant (BELSOMRA) 5 MG TABS Take by mouth at bedtime.    [provider]  tamsulosin  (FLOMAX ) 0.4 MG CAPS capsule TAKE 1 CAPSULE BY MOUTH EVERY DAY 06/03/24   McElwee, Tinnie LABOR, NP    Family History Family History  Problem Relation Age of Onset   CAD Brother    CAD Daughter 56    Social History Social History   Tobacco Use   Smoking status: Never   Smokeless tobacco: Never  Substance Use Topics   Alcohol use: Yes    Comment: occasional   Drug use: Never     Allergies   Prednisone and Garlic   Review of Systems Review of Systems  Constitutional:  Negative for fatigue and fever.  Respiratory:  Negative for shortness of breath.   Cardiovascular:  Negative for chest pain.  Gastrointestinal:  Negative for abdominal pain.  Musculoskeletal:  Positive for neck pain (chronic).  Neurological:  Negative for headaches.  All other systems reviewed and are negative.    Physical Exam Updated Vital Signs BP (!) 153/68   Pulse 73   Temp 97.8 F (36.6 C) (Oral)   Resp 16   Ht 5' 6 (1.676 m)   SpO2 100%   BMI 24.02 kg/m   Physical Exam Constitutional:      General: He is not in acute distress. HENT:     Head: Normocephalic and atraumatic.  Eyes:     Extraocular Movements: Extraocular movements intact.     Pupils: Pupils are equal, round, and reactive to  light.  Cardiovascular:     Rate and Rhythm: Normal rate and regular rhythm.     Pulses: Normal pulses.     Heart sounds: Normal heart sounds.  Pulmonary:     Effort: Pulmonary effort is normal. No respiratory distress.     Breath sounds: Normal breath sounds.  Abdominal:     General: Abdomen is flat.     Palpations: Abdomen is soft.     Tenderness: There is no abdominal tenderness.  Musculoskeletal:        General: Tenderness (over L anterior shoulder and distal humerus/elbow) present.  Cervical back: Neck supple. Tenderness (chronic) present.  Skin:    General: Skin is warm.     Capillary Refill: Capillary refill takes less than 2 seconds.  Neurological:     General: No focal deficit present.     Mental Status: He is alert. Mental status is at baseline.  Psychiatric:        Mood and Affect: Mood normal.        Behavior: Behavior normal.      ED Treatments / Results  Labs (all labs ordered are listed, but only abnormal results are displayed) Labs Reviewed - No data to display  EKG  Radiology DG Shoulder Left Result Date: 09/06/2024 CLINICAL DATA:  Fall onto LEFT shoulder. EXAM: LEFT SHOULDER - 2+ VIEW; LEFT HUMERUS - 2+ VIEW COMPARISON:  None available FINDINGS: LEFT shoulder: Mildly impacted and moderately displaced fracture of the LEFT proximal humerus. No additional fracture or dislocation of the LEFT mid or distal humerus. IMPRESSION: Mildly impacted and moderately displaced fracture of the LEFT proximal humerus. Electronically Signed   By: Aliene Lloyd M.D.   On: 09/06/2024 08:17   DG Humerus Left Result Date: 09/06/2024 CLINICAL DATA:  Fall onto LEFT shoulder. EXAM: LEFT SHOULDER - 2+ VIEW; LEFT HUMERUS - 2+ VIEW COMPARISON:  None available FINDINGS: LEFT shoulder: Mildly impacted and moderately displaced fracture of the LEFT proximal humerus. No additional fracture or dislocation of the LEFT mid or distal humerus. IMPRESSION: Mildly impacted and moderately displaced  fracture of the LEFT proximal humerus. Electronically Signed   By: Aliene Lloyd M.D.   On: 09/06/2024 08:17    Procedures Procedures (including critical care time)  Medications Ordered in ED Medications  oxyCODONE -acetaminophen  (PERCOCET/ROXICET) 5-325 MG per tablet 1 tablet (1 tablet Oral Given 09/06/24 0842)  morphine  (PF) 4 MG/ML injection 4 mg (4 mg Intramuscular Given 09/06/24 0950)     Initial Impression / Assessment and Plan / ED Course  I have reviewed the triage vital signs and the nursing notes.  Pertinent labs & imaging results that were available during my care of the patient were reviewed by me and considered in my medical decision making (see chart for details).  88 year old male with a past medical history of T2DM, dementia, glaucoma and GERD presents to the ED via EMS s/p witnessed fall at home. Wife witnessed fall and denies head contact. Patient denies head contact as well or LOC. Patient is alert and oriented x2, which is his baseline per family. Patient's pain was controlled with IM Morphine  4 mg in the ED. XR imaging positive for proximal humerus fracture with moderate displacement. Consulted orthopedics who recommended outpatient f/u. Patient's shoulder was immobilized with a sling and patient was discharged home in stable condition, agreeable to POC.  Final Clinical Impressions(s) / ED Diagnoses   Final diagnoses:  Other closed displaced fracture of proximal end of left humerus, initial encounter    New Prescriptions New Prescriptions   No medications on file

## 2024-09-06 NOTE — ED Notes (Signed)
 Ortho tech just left bedside. Shoulder immobilizer/sling provided. Ortho tech provided family education.

## 2024-09-06 NOTE — ED Triage Notes (Addendum)
 Pt arrived via [facility] from home c/o a fall that caused arm pain. Pt a/ox3 at baseline, disoriented to time. Pt was going to the bathroom and slipped on some clothes in the floor and fell onto his left shoulder. Hx of alzheimers. No thinners. Pt reports hitting his head when he fell. Witnessed fall by family, no LOC.

## 2024-09-06 NOTE — ED Provider Notes (Signed)
 [place] EMERGENCY DEPARTMENT AT [facility] Provider Note   CSN: [id number] Arrival date & time: 09/06/24  9359     Patient presents with: Fall and Arm Pain   Jeffery Stewart is a 88 y.o. male.   Patient reports that he slipped and fell striking his left shoulder.  Patient was with his spouse when the fall occurred patient reports that he did not strike his head.  Patient's wife states that she saw all the fall and the patient did not hit his head he landed specifically on his shoulder.  Patient complains of pain in his shoulder.  He has a past medical history of dementia angina dyslipidemia and diabetes.    The history is provided by the patient. No language interpreter was used.  Fall This is a new problem. The current episode started 1 to 2 hours ago. Pertinent negatives include no chest pain. Nothing aggravates the symptoms. Nothing relieves the symptoms. He has tried nothing for the symptoms.  Arm Pain Pertinent negatives include no chest pain.       Prior to Admission medications   Medication Sig Start Date End Date Taking? Authorizing Provider  Accu-Chek FastClix Lancets MISC USE 1  TO CHECK GLUCOSE ONCE DAILY 11/20/20   [provider]  acetaminophen  (TYLENOL ) 500 MG tablet Take 500 mg by mouth daily as needed for mild pain or moderate pain. Take as needed for back/neck pain.    [provider]  b complex vitamins capsule Take 1 capsule by mouth daily.    [provider]  Blood Glucose Monitoring Suppl (GLUCOCOM BLOOD GLUCOSE MONITOR) DEVI 1 Device by Misc.(Non-Drug; Combo Route) route 4 (four) times daily. 11/28/20   [provider]  clonazePAM  (KLONOPIN ) 1 MG tablet Take 1-2 tablets (1-2 mg total) by mouth at bedtime. 04/29/24   McElwee, Tinnie LABOR, NP  cycloSPORINE  (RESTASIS ) 0.05 % ophthalmic emulsion Place 1 drop into both eyes 2 (two) times daily.    [provider]  DULoxetine  (CYMBALTA ) 60 MG capsule Take 60 mg by mouth  daily.    [provider]  famotidine  (PEPCID ) 20 MG tablet Take 20 mg by mouth 2 (two) times daily.    [provider]  gabapentin  (NEURONTIN ) 300 MG capsule Take 300 mg by mouth daily at 6 (six) AM. Patients take 7 capsules a day.    [provider]  glucose blood (ACCU-CHEK GUIDE TEST) test strip Use as instructed 03/03/24   McElwee, Lauren A, NP  lidocaine (LIDODERM) 5 % Place 1 patch onto the skin daily. Remove & Discard patch within 12 hours or as directed by MD    [provider]  metFORMIN  (GLUCOPHAGE ) 1000 MG tablet Take 1 tablet (1,000 mg total) by mouth 2 (two) times daily with a meal. 03/03/24   McElwee, Lauren A, NP  nitroGLYCERIN  (NITROSTAT ) 0.4 MG SL tablet Place 1 tablet (0.4 mg total) under the tongue every 5 (five) minutes x 3 doses as needed for chest pain. 02/11/23   Samtani, Jai-Gurmukh, MD  sitaGLIPtin  (JANUVIA ) 100 MG tablet Take 1 tablet (100 mg total) by mouth daily. 03/03/24   McElwee, Lauren A, NP  Suvorexant (BELSOMRA) 5 MG TABS Take by mouth at bedtime.    [provider]  tamsulosin  (FLOMAX ) 0.4 MG CAPS capsule TAKE 1 CAPSULE BY MOUTH EVERY DAY 06/03/24   McElwee, Lauren A, NP    Allergies: Prednisone and Garlic    Review of Systems  Cardiovascular:  Negative for chest pain.  All other systems reviewed and are negative.   Updated Vital Signs BP (!) 153/68   Pulse 73   Temp 97.8 F (36.6 C) (Oral)   Resp 16   Ht 5' 6 (1.676 m)   SpO2 100%   BMI 24.02 kg/m   Physical Exam Vitals and nursing note reviewed.  Constitutional:      Appearance: He is well-developed.  HENT:     Head: Normocephalic.  Cardiovascular:     Rate and Rhythm: Normal rate.  Pulmonary:     Effort: Pulmonary effort is normal.  Abdominal:     General: There is no distension.  Musculoskeletal:        General: Swelling and tenderness present.     Cervical back: Normal range of motion.     Comments: Swollen tender left shoulder, pain with  movement.  Nv and ns inatact   Skin:    General: Skin is warm.  Neurological:     General: No focal deficit present.     Mental Status: He is alert and oriented to person, place, and time.     (all labs ordered are listed, but only abnormal results are displayed) Labs Reviewed - No data to display  EKG: None  Radiology: DG Shoulder Left Result Date: 09/06/2024 CLINICAL DATA:  Fall onto LEFT shoulder. EXAM: LEFT SHOULDER - 2+ VIEW; LEFT HUMERUS - 2+ VIEW COMPARISON:  None available FINDINGS: LEFT shoulder: Mildly impacted and moderately displaced fracture of the LEFT proximal humerus. No additional fracture or dislocation of the LEFT mid or distal humerus. IMPRESSION: Mildly impacted and moderately displaced fracture of the LEFT proximal humerus. Electronically Signed   By: Aliene Lloyd M.D.   On: 09/06/2024 08:17   DG Humerus Left Result Date: 09/06/2024 CLINICAL DATA:  Fall onto LEFT shoulder. EXAM: LEFT SHOULDER - 2+ VIEW; LEFT HUMERUS - 2+ VIEW COMPARISON:  None available FINDINGS: LEFT shoulder: Mildly impacted and moderately displaced fracture of the LEFT proximal humerus. No additional fracture or dislocation of the LEFT mid or distal humerus. IMPRESSION: Mildly impacted and moderately displaced fracture of the LEFT proximal humerus. Electronically Signed   By: Aliene Lloyd M.D.   On: 09/06/2024 08:17     Procedures   Medications Ordered in the ED  oxyCODONE -acetaminophen  (PERCOCET/ROXICET) 5-325 MG per tablet 1 tablet (1 tablet Oral Given 09/06/24 0842)  morphine  (PF) 4 MG/ML injection 4 mg (4 mg Intramuscular Given 09/06/24 0950)                                    Medical Decision Making Pt complains of pain in his left shoulder after falling and hitting his shoulder   Amount and/or Complexity of Data Reviewed Independent Historian: spouse    Details: Pt's wife saw pt fall.  No impact of head.   Radiology: ordered and independent interpretation performed. Decision-making  details documented in ED Course.    Details: Left shoulder  xray shows impacted and moderately displaced fracture .   Risk Prescription drug management. Risk Details: I discussed the pt with Dr. Ernie  on call.  Pt has been seen at [facility] Dr. Ernie advised to schedule to be seen. Shoulder immbolizer placed.  Pt given rx for hydrocodone.         Final diagnoses:  Other closed displaced fracture of proximal end of left humerus, initial encounter    ED Discharge Orders  Ordered    oxyCODONE -acetaminophen  (PERCOCET) 5-325 MG tablet  Every 4 hours PRN        09/06/24 1104            An After Visit Summary was printed and given to the patient.    Flint Sonny POUR, PA-C 09/06/24 1112    Levander Houston, MD 09/06/24 [phone]

## 2024-09-06 NOTE — Progress Notes (Signed)
 Orthopedic Tech Progress Note Patient Details:  Jeffery Stewart 05/03/1936 [id number]  Ortho Devices Type of Ortho Device: Shoulder immobilizer Ortho Device/Splint Location: LUE Ortho Device/Splint Interventions: Application, Adjustment   Post Interventions Patient Tolerated: Well Instructions Provided: Adjustment of device (educated family on adjustment as pt in bed and unable to apply waist strap at this time)  [name] OTR/L  09/06/2024, 9:07 AM

## 2024-09-06 NOTE — ED Notes (Signed)
Pt returned from XRAY 

## 2024-09-06 NOTE — ED Notes (Signed)
 Pt family at bedside. Saw resolution of pain after morphine  administration. 02 sats holding above 95%

## 2024-09-07 ENCOUNTER — Encounter: Payer: Self-pay | Admitting: Nurse Practitioner

## 2024-09-07 NOTE — Telephone Encounter (Signed)
 I called and spoke with patient's wife and notified her that Tinnie is out of the office till tomorrow but I will forward her messages.

## 2024-09-07 NOTE — Telephone Encounter (Signed)
 Copied from CRM #[id number]. Topic: Clinical - Medical Advice >> Sep 07, 2024  9:35 AM Dedra B wrote: Reason for CRM: Pt's wife, Avelina, would like Tinnie Harada to review the message she sent in MyChart regarding pt's fall, arm fracture, and fever. Pls call Avelina at [phone].

## 2024-09-08 ENCOUNTER — Emergency Department (HOSPITAL_COMMUNITY)

## 2024-09-08 ENCOUNTER — Other Ambulatory Visit: Payer: Self-pay

## 2024-09-08 ENCOUNTER — Ambulatory Visit: Admitting: Nurse Practitioner

## 2024-09-08 ENCOUNTER — Inpatient Hospital Stay (HOSPITAL_COMMUNITY)
Admission: EM | Admit: 2024-09-08 | Discharge: 2024-09-16 | Disposition: A | Attending: Internal Medicine | Admitting: Internal Medicine

## 2024-09-08 ENCOUNTER — Encounter (HOSPITAL_COMMUNITY): Payer: Self-pay | Admitting: Internal Medicine

## 2024-09-08 ENCOUNTER — Encounter: Payer: Self-pay | Admitting: Nurse Practitioner

## 2024-09-08 DIAGNOSIS — J159 Unspecified bacterial pneumonia: Secondary | ICD-10-CM | POA: Diagnosis present

## 2024-09-08 DIAGNOSIS — R569 Unspecified convulsions: Secondary | ICD-10-CM | POA: Diagnosis not present

## 2024-09-08 DIAGNOSIS — Z1152 Encounter for screening for COVID-19: Secondary | ICD-10-CM

## 2024-09-08 DIAGNOSIS — F03918 Unspecified dementia, unspecified severity, with other behavioral disturbance: Secondary | ICD-10-CM | POA: Diagnosis present

## 2024-09-08 DIAGNOSIS — R400 Somnolence: Secondary | ICD-10-CM | POA: Diagnosis present

## 2024-09-08 DIAGNOSIS — F32A Depression, unspecified: Secondary | ICD-10-CM | POA: Diagnosis present

## 2024-09-08 DIAGNOSIS — D631 Anemia in chronic kidney disease: Secondary | ICD-10-CM | POA: Diagnosis present

## 2024-09-08 DIAGNOSIS — J189 Pneumonia, unspecified organism: Secondary | ICD-10-CM | POA: Diagnosis not present

## 2024-09-08 DIAGNOSIS — S42292D Other displaced fracture of upper end of left humerus, subsequent encounter for fracture with routine healing: Secondary | ICD-10-CM

## 2024-09-08 DIAGNOSIS — J690 Pneumonitis due to inhalation of food and vomit: Secondary | ICD-10-CM | POA: Diagnosis present

## 2024-09-08 DIAGNOSIS — E872 Acidosis, unspecified: Secondary | ICD-10-CM | POA: Diagnosis present

## 2024-09-08 DIAGNOSIS — G8929 Other chronic pain: Secondary | ICD-10-CM | POA: Diagnosis present

## 2024-09-08 DIAGNOSIS — K5909 Other constipation: Secondary | ICD-10-CM | POA: Diagnosis present

## 2024-09-08 DIAGNOSIS — Y92009 Unspecified place in unspecified non-institutional (private) residence as the place of occurrence of the external cause: Secondary | ICD-10-CM | POA: Diagnosis not present

## 2024-09-08 DIAGNOSIS — G9341 Metabolic encephalopathy: Secondary | ICD-10-CM | POA: Diagnosis present

## 2024-09-08 DIAGNOSIS — Z515 Encounter for palliative care: Secondary | ICD-10-CM

## 2024-09-08 DIAGNOSIS — Z8249 Family history of ischemic heart disease and other diseases of the circulatory system: Secondary | ICD-10-CM

## 2024-09-08 DIAGNOSIS — A419 Sepsis, unspecified organism: Secondary | ICD-10-CM | POA: Diagnosis not present

## 2024-09-08 DIAGNOSIS — W010XXA Fall on same level from slipping, tripping and stumbling without subsequent striking against object, initial encounter: Secondary | ICD-10-CM | POA: Diagnosis present

## 2024-09-08 DIAGNOSIS — Z79899 Other long term (current) drug therapy: Secondary | ICD-10-CM

## 2024-09-08 DIAGNOSIS — R3915 Urgency of urination: Secondary | ICD-10-CM | POA: Diagnosis present

## 2024-09-08 DIAGNOSIS — E1122 Type 2 diabetes mellitus with diabetic chronic kidney disease: Secondary | ICD-10-CM | POA: Diagnosis present

## 2024-09-08 DIAGNOSIS — Z91018 Allergy to other foods: Secondary | ICD-10-CM

## 2024-09-08 DIAGNOSIS — G301 Alzheimer's disease with late onset: Secondary | ICD-10-CM | POA: Diagnosis not present

## 2024-09-08 DIAGNOSIS — K5900 Constipation, unspecified: Secondary | ICD-10-CM | POA: Diagnosis present

## 2024-09-08 DIAGNOSIS — E875 Hyperkalemia: Secondary | ICD-10-CM | POA: Diagnosis present

## 2024-09-08 DIAGNOSIS — E871 Hypo-osmolality and hyponatremia: Secondary | ICD-10-CM | POA: Diagnosis present

## 2024-09-08 DIAGNOSIS — F02C18 Dementia in other diseases classified elsewhere, severe, with other behavioral disturbance: Secondary | ICD-10-CM | POA: Diagnosis not present

## 2024-09-08 DIAGNOSIS — N1832 Chronic kidney disease, stage 3b: Secondary | ICD-10-CM | POA: Diagnosis present

## 2024-09-08 DIAGNOSIS — Z8546 Personal history of malignant neoplasm of prostate: Secondary | ICD-10-CM

## 2024-09-08 DIAGNOSIS — E1140 Type 2 diabetes mellitus with diabetic neuropathy, unspecified: Secondary | ICD-10-CM | POA: Diagnosis present

## 2024-09-08 DIAGNOSIS — G14 Postpolio syndrome: Secondary | ICD-10-CM | POA: Diagnosis present

## 2024-09-08 DIAGNOSIS — G4709 Other insomnia: Secondary | ICD-10-CM | POA: Diagnosis present

## 2024-09-08 DIAGNOSIS — F0394 Unspecified dementia, unspecified severity, with anxiety: Secondary | ICD-10-CM | POA: Diagnosis present

## 2024-09-08 DIAGNOSIS — Z66 Do not resuscitate: Secondary | ICD-10-CM | POA: Diagnosis present

## 2024-09-08 DIAGNOSIS — F0390 Unspecified dementia without behavioral disturbance: Secondary | ICD-10-CM | POA: Diagnosis present

## 2024-09-08 DIAGNOSIS — Z888 Allergy status to other drugs, medicaments and biological substances status: Secondary | ICD-10-CM

## 2024-09-08 DIAGNOSIS — S42292A Other displaced fracture of upper end of left humerus, initial encounter for closed fracture: Secondary | ICD-10-CM | POA: Diagnosis present

## 2024-09-08 DIAGNOSIS — M545 Low back pain, unspecified: Secondary | ICD-10-CM | POA: Diagnosis present

## 2024-09-08 DIAGNOSIS — L89151 Pressure ulcer of sacral region, stage 1: Secondary | ICD-10-CM | POA: Diagnosis present

## 2024-09-08 DIAGNOSIS — R6520 Severe sepsis without septic shock: Secondary | ICD-10-CM | POA: Diagnosis present

## 2024-09-08 DIAGNOSIS — Z85820 Personal history of malignant melanoma of skin: Secondary | ICD-10-CM

## 2024-09-08 DIAGNOSIS — H409 Unspecified glaucoma: Secondary | ICD-10-CM | POA: Diagnosis present

## 2024-09-08 DIAGNOSIS — Z7189 Other specified counseling: Secondary | ICD-10-CM | POA: Diagnosis not present

## 2024-09-08 DIAGNOSIS — E1165 Type 2 diabetes mellitus with hyperglycemia: Secondary | ICD-10-CM | POA: Diagnosis present

## 2024-09-08 DIAGNOSIS — R339 Retention of urine, unspecified: Secondary | ICD-10-CM | POA: Diagnosis not present

## 2024-09-08 DIAGNOSIS — Z7984 Long term (current) use of oral hypoglycemic drugs: Secondary | ICD-10-CM

## 2024-09-08 LAB — COMPREHENSIVE METABOLIC PANEL WITH GFR
ALT: 6 U/L (ref 0–44)
AST: 14 U/L — ABNORMAL LOW (ref 15–41)
Albumin: 3.7 g/dL (ref 3.5–5.0)
Alkaline Phosphatase: 55 U/L (ref 38–126)
Anion gap: 11 (ref 5–15)
BUN: 31 mg/dL — ABNORMAL HIGH (ref 8–23)
CO2: 24 mmol/L (ref 22–32)
Calcium: 9.1 mg/dL (ref 8.9–10.3)
Chloride: 99 mmol/L (ref 98–111)
Creatinine, Ser: 1.44 mg/dL — ABNORMAL HIGH (ref 0.61–1.24)
GFR, Estimated: 47 mL/min — ABNORMAL LOW (ref >60)
Glucose, Bld: 292 mg/dL — ABNORMAL HIGH (ref 70–99)
Potassium: 5.3 mmol/L — ABNORMAL HIGH (ref 3.5–5.1)
Sodium: 135 mmol/L (ref 135–145)
Total Bilirubin: 0.3 mg/dL (ref 0.0–1.2)
Total Protein: 6.4 g/dL — ABNORMAL LOW (ref 6.5–8.1)

## 2024-09-08 LAB — I-STAT CG4 LACTIC ACID, ED
Lactic Acid, Venous: 3.3 mmol/L (ref 0.5–1.9)
Lactic Acid, Venous: 2.9 mmol/L (ref 0.5–1.9)

## 2024-09-08 LAB — CBC WITH DIFFERENTIAL/PLATELET
Abs Immature Granulocytes: 0.08 K/uL — ABNORMAL HIGH (ref 0.00–0.07)
Basophils Absolute: 0 K/uL (ref 0.0–0.1)
Basophils Relative: 0 %
Eosinophils Absolute: 0 K/uL (ref 0.0–0.5)
Eosinophils Relative: 0 %
HCT: 35.6 % — ABNORMAL LOW (ref 39.0–52.0)
Hemoglobin: 11.4 g/dL — ABNORMAL LOW (ref 13.0–17.0)
Immature Granulocytes: 1 %
Lymphocytes Relative: 6 %
Lymphs Abs: 0.9 K/uL (ref 0.7–4.0)
MCH: 29.5 pg (ref 26.0–34.0)
MCHC: 32 g/dL (ref 30.0–36.0)
MCV: 92.2 fL (ref 80.0–100.0)
Monocytes Absolute: 1 K/uL (ref 0.1–1.0)
Monocytes Relative: 7 %
Neutro Abs: 13 K/uL — ABNORMAL HIGH (ref 1.7–7.7)
Neutrophils Relative %: 86 %
Platelets: 301 K/uL (ref 150–400)
RBC: 3.86 MIL/uL — ABNORMAL LOW (ref 4.22–5.81)
RDW: 14.1 % (ref 11.5–15.5)
WBC: 14.9 K/uL — ABNORMAL HIGH (ref 4.0–10.5)
nRBC: 0 % (ref 0.0–0.2)

## 2024-09-08 LAB — URINALYSIS, W/ REFLEX TO CULTURE (INFECTION SUSPECTED)
Bacteria, UA: NONE SEEN
Bilirubin Urine: NEGATIVE
Glucose, UA: 150 mg/dL — AB
Hgb urine dipstick: NEGATIVE
Ketones, ur: NEGATIVE mg/dL
Leukocytes,Ua: NEGATIVE
Nitrite: NEGATIVE
Protein, ur: NEGATIVE mg/dL
Specific Gravity, Urine: 1.014 (ref 1.005–1.030)
pH: 5 (ref 5.0–8.0)

## 2024-09-08 LAB — PROTIME-INR
INR: 1 (ref 0.8–1.2)
Prothrombin Time: 14 s (ref 11.4–15.2)

## 2024-09-08 LAB — RESP PANEL BY RT-PCR (RSV, FLU A&B, COVID)  RVPGX2
Influenza A by PCR: NEGATIVE
Influenza B by PCR: NEGATIVE
Resp Syncytial Virus by PCR: NEGATIVE
SARS Coronavirus 2 by RT PCR: NEGATIVE

## 2024-09-08 LAB — GLUCOSE, CAPILLARY: Glucose-Capillary: 200 mg/dL — ABNORMAL HIGH (ref 70–99)

## 2024-09-08 MED ORDER — DOCUSATE SODIUM 100 MG PO CAPS
100.0000 mg | ORAL_CAPSULE | Freq: Two times a day (BID) | ORAL | Status: AC
  Administered 2024-09-08 – 2024-09-15 (×11): 100 mg via ORAL
  Filled 2024-09-08 (×13): qty 1

## 2024-09-08 MED ORDER — ZOLPIDEM TARTRATE 5 MG PO TABS
5.0000 mg | ORAL_TABLET | Freq: Every day | ORAL | Status: DC
  Administered 2024-09-08 – 2024-09-16 (×6): 5 mg via ORAL
  Filled 2024-09-08 (×9): qty 1

## 2024-09-08 MED ORDER — HYDROXYZINE HCL 25 MG PO TABS
25.0000 mg | ORAL_TABLET | Freq: Every day | ORAL | Status: DC | PRN
  Administered 2024-09-10 – 2024-09-16 (×8): 25 mg via ORAL
  Filled 2024-09-08 (×9): qty 1

## 2024-09-08 MED ORDER — LACTATED RINGERS IV BOLUS (SEPSIS)
1000.0000 mL | Freq: Once | INTRAVENOUS | Status: AC
  Administered 2024-09-08: 1000 mL via INTRAVENOUS

## 2024-09-08 MED ORDER — ONDANSETRON HCL 4 MG PO TABS: 4.0000 mg | ORAL_TABLET | Freq: Four times a day (QID) | ORAL | Status: DC | PRN

## 2024-09-08 MED ORDER — DULOXETINE HCL 20 MG PO CPEP
60.0000 mg | ORAL_CAPSULE | Freq: Every day | ORAL | Status: DC
  Administered 2024-09-09 – 2024-09-16 (×8): 60 mg via ORAL
  Filled 2024-09-08 (×7): qty 3

## 2024-09-08 MED ORDER — CLONAZEPAM 1 MG PO TABS
1.0000 mg | ORAL_TABLET | Freq: Every day | ORAL | Status: DC
  Administered 2024-09-08 – 2024-09-16 (×7): 1 mg via ORAL
  Filled 2024-09-08 (×9): qty 1

## 2024-09-08 MED ORDER — MORPHINE SULFATE (PF) 2 MG/ML IV SOLN
2.0000 mg | INTRAVENOUS | Status: DC | PRN
  Administered 2024-09-11 – 2024-09-14 (×9): 2 mg via INTRAVENOUS
  Filled 2024-09-08 (×9): qty 1

## 2024-09-08 MED ORDER — MORPHINE SULFATE (PF) 4 MG/ML IV SOLN
4.0000 mg | Freq: Once | INTRAVENOUS | Status: AC
  Administered 2024-09-08: 4 mg via INTRAVENOUS
  Filled 2024-09-08: qty 1

## 2024-09-08 MED ORDER — GABAPENTIN 300 MG PO CAPS
300.0000 mg | ORAL_CAPSULE | Freq: Three times a day (TID) | ORAL | Status: DC
  Administered 2024-09-08 – 2024-09-14 (×18): 300 mg via ORAL
  Filled 2024-09-08 (×19): qty 1

## 2024-09-08 MED ORDER — ACETAMINOPHEN 650 MG RE SUPP: 650.0000 mg | Freq: Four times a day (QID) | RECTAL | Status: DC | PRN

## 2024-09-08 MED ORDER — SODIUM CHLORIDE 0.9 % IV SOLN
2.0000 g | INTRAVENOUS | Status: DC
  Administered 2024-09-09 – 2024-09-11 (×3): 2 g via INTRAVENOUS
  Filled 2024-09-08 (×3): qty 20

## 2024-09-08 MED ORDER — ONDANSETRON HCL 4 MG/2ML IJ SOLN
4.0000 mg | Freq: Once | INTRAMUSCULAR | Status: AC
  Administered 2024-09-08: 4 mg via INTRAVENOUS
  Filled 2024-09-08: qty 2

## 2024-09-08 MED ORDER — ACETAMINOPHEN 325 MG PO TABS
650.0000 mg | ORAL_TABLET | Freq: Four times a day (QID) | ORAL | Status: DC | PRN
  Administered 2024-09-09 – 2024-09-13 (×4): 650 mg via ORAL
  Filled 2024-09-08 (×5): qty 2

## 2024-09-08 MED ORDER — OXYCODONE HCL 5 MG PO TABS
5.0000 mg | ORAL_TABLET | ORAL | Status: DC | PRN
  Administered 2024-09-09 – 2024-09-16 (×10): 5 mg via ORAL
  Filled 2024-09-08 (×12): qty 1

## 2024-09-08 MED ORDER — VITAMIN D 25 MCG (1000 UNIT) PO TABS
2000.0000 [IU] | ORAL_TABLET | Freq: Every day | ORAL | Status: DC
  Administered 2024-09-09 – 2024-09-15 (×7): 2000 [IU] via ORAL
  Filled 2024-09-08 (×7): qty 2

## 2024-09-08 MED ORDER — SODIUM CHLORIDE 0.9 % IV SOLN
500.0000 mg | Freq: Once | INTRAVENOUS | Status: AC
  Administered 2024-09-08: 500 mg via INTRAVENOUS
  Filled 2024-09-08: qty 5

## 2024-09-08 MED ORDER — VITAMIN D3 50 MCG (2000 UT) PO CHEW: 2000.0000 [IU] | CHEWABLE_TABLET | Freq: Every day | ORAL | Status: DC

## 2024-09-08 MED ORDER — VITAMIN B-12 1000 MCG PO TABS
2000.0000 ug | ORAL_TABLET | Freq: Every day | ORAL | Status: DC
  Administered 2024-09-09 – 2024-09-15 (×7): 2000 ug via ORAL
  Filled 2024-09-08 (×7): qty 2

## 2024-09-08 MED ORDER — POLYETHYLENE GLYCOL 3350 17 G PO PACK
17.0000 g | PACK | Freq: Every day | ORAL | Status: DC
  Administered 2024-09-08 – 2024-09-16 (×7): 17 g via ORAL
  Filled 2024-09-08 (×9): qty 1

## 2024-09-08 MED ORDER — NITROGLYCERIN 0.4 MG SL SUBL: 0.4000 mg | SUBLINGUAL_TABLET | SUBLINGUAL | Status: DC | PRN

## 2024-09-08 MED ORDER — SUVOREXANT 5 MG PO TABS: 5.0000 mg | ORAL_TABLET | ORAL | Status: DC

## 2024-09-08 MED ORDER — ONDANSETRON HCL 4 MG/2ML IJ SOLN: 4.0000 mg | Freq: Four times a day (QID) | INTRAMUSCULAR | Status: DC | PRN

## 2024-09-08 MED ORDER — IOHEXOL 300 MG/ML  SOLN
80.0000 mL | Freq: Once | INTRAMUSCULAR | Status: AC | PRN
  Administered 2024-09-08: 80 mL via INTRAVENOUS

## 2024-09-08 MED ORDER — LIDOCAINE 5 % EX PTCH
1.0000 | MEDICATED_PATCH | Freq: Every day | CUTANEOUS | Status: DC | PRN
  Administered 2024-09-10 – 2024-09-16 (×5): 1 via TRANSDERMAL
  Filled 2024-09-08 (×5): qty 1

## 2024-09-08 MED ORDER — SODIUM CHLORIDE 0.9 % IV SOLN
2.0000 g | Freq: Once | INTRAVENOUS | Status: AC
  Administered 2024-09-08: 2 g via INTRAVENOUS
  Filled 2024-09-08: qty 12.5

## 2024-09-08 MED ORDER — AZITHROMYCIN 250 MG PO TABS
500.0000 mg | ORAL_TABLET | Freq: Every day | ORAL | Status: AC
  Administered 2024-09-09 – 2024-09-13 (×5): 500 mg via ORAL
  Filled 2024-09-08 (×5): qty 2

## 2024-09-08 MED ORDER — BISACODYL 10 MG RE SUPP
10.0000 mg | Freq: Once | RECTAL | Status: AC
  Administered 2024-09-08: 10 mg via RECTAL
  Filled 2024-09-08: qty 1

## 2024-09-08 MED ORDER — INSULIN ASPART 100 UNIT/ML IJ SOLN
0.0000 [IU] | Freq: Three times a day (TID) | INTRAMUSCULAR | Status: DC
  Administered 2024-09-09: 2 [IU] via SUBCUTANEOUS
  Administered 2024-09-09 – 2024-09-10 (×2): 3 [IU] via SUBCUTANEOUS
  Administered 2024-09-10: 2 [IU] via SUBCUTANEOUS
  Administered 2024-09-11: 1 [IU] via SUBCUTANEOUS
  Administered 2024-09-11 – 2024-09-12 (×5): 2 [IU] via SUBCUTANEOUS
  Filled 2024-09-08: qty 1
  Filled 2024-09-08: qty 3
  Filled 2024-09-08 (×3): qty 2
  Filled 2024-09-08: qty 3
  Filled 2024-09-08 (×4): qty 2

## 2024-09-08 MED ORDER — ACETAMINOPHEN 650 MG RE SUPP: 650.0000 mg | Freq: Once | RECTAL | Status: DC

## 2024-09-08 MED ORDER — B-12 1000 MCG PO CHEW: 2000.0000 ug | CHEWABLE_TABLET | Freq: Every day | ORAL | Status: DC

## 2024-09-08 MED ORDER — FAMOTIDINE 20 MG PO TABS
20.0000 mg | ORAL_TABLET | Freq: Two times a day (BID) | ORAL | Status: DC
  Administered 2024-09-08 – 2024-09-16 (×16): 20 mg via ORAL
  Filled 2024-09-08 (×17): qty 1

## 2024-09-08 MED ORDER — GABAPENTIN 300 MG PO CAPS: 300.0000 mg | ORAL_CAPSULE | ORAL | Status: DC

## 2024-09-08 MED ORDER — CYCLOSPORINE 0.05 % OP EMUL
1.0000 [drp] | Freq: Two times a day (BID) | OPHTHALMIC | Status: DC
  Administered 2024-09-08 – 2024-09-16 (×16): 1 [drp] via OPHTHALMIC
  Filled 2024-09-08 (×18): qty 30

## 2024-09-08 MED ORDER — ENOXAPARIN SODIUM 40 MG/0.4ML IJ SOSY
40.0000 mg | PREFILLED_SYRINGE | INTRAMUSCULAR | Status: DC
  Administered 2024-09-08 – 2024-09-16 (×9): 40 mg via SUBCUTANEOUS
  Filled 2024-09-08 (×9): qty 0.4

## 2024-09-08 NOTE — H&P (Signed)
 History and Physical    Jeffery Stewart FMW:[id number] DOB: [birth year] DOA: 09/08/2024  PCP: Jeffery Stewart LABOR, NP  Patient coming from: Home  I have personally briefly reviewed patient's old medical records available.   Chief Complaint: Confused and low-grade fever at home.  HPI: Jeffery Stewart is a 88 y.o. male with medical history significant of advanced dementia, type 2 diabetes, neuropathy, postpolio syndrome who lives at home with his wife.  He had a mechanical fall 2 days ago and suffered left humerus fracture.  Was seen in the emergency room and sent home with sling.  Patient went to see orthopedics yesterday, was advised conservative management.  Patient does have restlessness, anxiety at baseline.  Since today morning he was noted to be more lethargic, wife noted he was having low-grade fever.  Did not notice any high-grade fever, chills or rigors, cough or congestion.  No vomiting.  Patient is chronically constipated and did not have any meaningful bowel movement for the last few days.  History was all given by patient's wife and family at the bedside.  Patient does not provide any history. ED Course: Blood pressures elevated.  On room air.  WC count 14.9.  Creatinine 1.44 slightly above the baseline.  Glucose 292.  Lactic acid 3.3-2.9 on repeat.  CT scan abdomen pelvis without any acute findings but stool burden.  Chest x-ray with possible right middle lobe infiltrate. Patient was given 1.5 L isotonic fluid by EMS and ER, cefepime  and azithromycin  was given after drawing blood cultures.  Admission requested due to significant symptoms.  We discussed in detail at the bedside about symptom management, treatment for infection.  Family is also focused on achieving comfort from pain and anxiety.  Review of Systems: Limited due to altered mental status, supplemented by family.   Past Medical History:  Diagnosis Date   Arthritis    Dementia (HCC) 2012   Diabetes mellitus without complication  (HCC)    GERD (gastroesophageal reflux disease)    Glaucoma    History of prostate cancer    Neuropathy    Post-polio syndrome (HCC)    possible weakned intercostal muscles, fatigues easily    Past Surgical History:  Procedure Laterality Date   EYE SURGERY     bilateral stent for glaucoma   INSERTION PROSTATE RADIATION SEED  2007   MELANOMA EXCISION Left 2016   elbow    Social history   reports that he has never smoked. He has never used smokeless tobacco. He reports current alcohol use. He reports that he does not use drugs.  Allergies  Allergen Reactions   Garlic Nausea Only and Other (See Comments)    Face and body get hot- for a few days afterwards. The patient gets sick. He CANNOT have garlic- in ANY form.    Family History  Problem Relation Age of Onset   CAD Brother    CAD Daughter 15     Prior to Admission medications   Medication Sig Start Date End Date Taking? Authorizing Provider  acetaminophen  (TYLENOL ) 500 MG tablet Take 500 mg by mouth every 8 (eight) hours as needed (for pain).   Yes [provider]  b complex vitamins capsule Take 1 capsule by mouth daily.   Yes [provider]  BELSOMRA 5 MG TABS Take 5 mg by mouth See admin instructions. Take 5 mg by mouth at bedtime and an additional 5 mg later in the night as needed for unresolved sleeplessness   Yes [provider]  Cholecalciferol (VITAMIN D3) 50 MCG (2000 UT) CHEW Chew 2,000 Units by mouth daily.   Yes [provider]  clonazePAM  (KLONOPIN ) 1 MG tablet Take 1-2 tablets (1-2 mg total) by mouth at bedtime. Patient taking differently: Take 1 mg by mouth at bedtime. 04/29/24  Yes McElwee, Lauren A, NP  Cyanocobalamin  (B-12) 1000 MCG CHEW Chew 2,000 mcg by mouth daily.   Yes [provider]  cycloSPORINE  (RESTASIS ) 0.05 % ophthalmic emulsion Place 1 drop into both eyes 2 (two) times daily.   Yes [provider]  DULoxetine  (CYMBALTA ) 60 MG capsule  Take 60 mg by mouth daily.   Yes [provider]  famotidine  (PEPCID ) 20 MG tablet Take 20 mg by mouth 2 (two) times daily.   Yes [provider]  gabapentin  (NEURONTIN ) 300 MG capsule Take 300-600 mg by mouth See admin instructions. Take 600 mg by mouth in the morning, 300 mg at lunchtime, 600 mg at suppertime, and 600 mg at bedtime   Yes [provider]  hydrOXYzine (ATARAX) 25 MG tablet Take 25 mg by mouth daily as needed for anxiety (and an additional 25 mg later- if needed for unresolved anxiousness).   Yes [provider]  lidocaine (LIDODERM) 5 % Place 1 patch onto the skin daily as needed (for pain- Remove & Discard patch within 12 hours or as directed by MD).   Yes [provider]  metFORMIN  (GLUCOPHAGE ) 1000 MG tablet Take 1 tablet (1,000 mg total) by mouth 2 (two) times daily with a meal. 03/03/24  Yes McElwee, Lauren A, NP  nitroGLYCERIN  (NITROSTAT ) 0.4 MG SL tablet Place 1 tablet (0.4 mg total) under the tongue every 5 (five) minutes x 3 doses as needed for chest pain. 02/11/23  Yes Samtani, Jai-Gurmukh, MD  oxyCODONE -acetaminophen  (PERCOCET) 5-325 MG tablet Take 1 tablet by mouth every 4 (four) hours as needed for severe pain (pain score 7-10) or moderate pain (pain score 4-6). 09/06/24 09/06/25 Yes Sofia, Leslie K, PA-C  sitaGLIPtin  (JANUVIA ) 100 MG tablet Take 1 tablet (100 mg total) by mouth daily. Patient taking differently: Take 100 mg by mouth at bedtime. 03/03/24  Yes McElwee, Lauren A, NP  Accu-Chek FastClix Lancets MISC USE 1  TO CHECK GLUCOSE ONCE DAILY 11/20/20   [provider]  Blood Glucose Monitoring Suppl (GLUCOCOM BLOOD GLUCOSE MONITOR) DEVI 1 Device by Misc.(Non-Drug; Combo Route) route 4 (four) times daily. 11/28/20   [provider]  glucose blood (ACCU-CHEK GUIDE TEST) test strip Use as instructed 03/03/24   McElwee, Stewart LABOR, NP  tamsulosin  (FLOMAX ) 0.4 MG CAPS capsule TAKE 1 CAPSULE BY MOUTH EVERY DAY Patient not  taking: Reported on 09/08/2024 06/03/24   Jeffery Stewart LABOR, NP    Physical Exam: Vitals:   09/08/24 1618 09/08/24 1621 09/08/24 1715 09/08/24 2006  BP: 135/65   (!) 184/79  Pulse: 80   61  Resp: 16   16  Temp: 98.5 F (36.9 C)  99.3 F (37.4 C) 98.6 F (37 C)  TempSrc: Oral  Rectal Oral  SpO2: 95%   91%  Weight:  67.5 kg    Height:  5' 6 (1.676 m)      Constitutional: Looks comfortable at rest.  Painful and uncomfortable with mobility, out of bed. Vitals:   09/08/24 1618 09/08/24 1621 09/08/24 1715 09/08/24 2006  BP: 135/65   (!) 184/79  Pulse: 80   61  Resp: 16   16  Temp: 98.5 F (36.9 C)  99.3 F (37.4 C)  98.6 F (37 C)  TempSrc: Oral  Rectal Oral  SpO2: 95%   91%  Weight:  67.5 kg    Height:  5' 6 (1.676 m)     Eyes: PERRL, lids and conjunctivae normal ENMT: Mucous membranes are dry.. Posterior pharynx clear of any exudate or lesions.Normal dentition.  Neck: normal, supple, no masses, no thyromegaly Respiratory: clear to auscultation bilaterally, no wheezing, no crackles. Normal respiratory effort. No accessory muscle use.  On room air. Cardiovascular: Regular rate and rhythm, no murmurs / rubs / gallops. No extremity edema. 2+ pedal pulses. No carotid bruits.  Abdomen: no tenderness, no masses palpated. No hepatosplenomegaly. Bowel sounds positive.  Patient winces on exam, however on distraction not able to feel any tenderness or rigidity. Musculoskeletal: no clubbing / cyanosis. No joint deformity upper and lower extremities. Good ROM, no contractures. Normal muscle tone.  Left shoulder on sling, has ecchymosis on the arm.  Distal neurovascular status intact. Skin: no rashes, lesions, ulcers. No induration Neurologic: CN 2-12 grossly intact. Sensation intact, DTR normal.  Psychiatric: Fair judgment and insight.  Alert and awake but not oriented.  Occasionally restless.    Labs on Admission: I have personally reviewed following labs and imaging  studies  CBC: Recent Labs  Lab 09/08/24 1638  WBC 14.9*  NEUTROABS 13.0*  HGB 11.4*  HCT 35.6*  MCV 92.2  PLT 301   Basic Metabolic Panel: Recent Labs  Lab 09/08/24 1638  NA 135  K 5.3*  CL 99  CO2 24  GLUCOSE 292*  BUN 31*  CREATININE 1.44*  CALCIUM  9.1   GFR: Estimated Creatinine Clearance: 33.2 mL/min (A) (by C-G formula based on SCr of 1.44 mg/dL (H)). Liver Function Tests: Recent Labs  Lab 09/08/24 1638  AST 14*  ALT 6  ALKPHOS 55  BILITOT 0.3  PROT 6.4*  ALBUMIN 3.7   No results for input(s): LIPASE, AMYLASE in the last 168 hours. No results for input(s): AMMONIA in the last 168 hours. Coagulation Profile: Recent Labs  Lab 09/08/24 1638  INR 1.0   Cardiac Enzymes: No results for input(s): CKTOTAL, CKMB, CKMBINDEX, TROPONINI in the last 168 hours. BNP (last 3 results) Recent Labs    06/28/24 1619  PROBNP 44.0   HbA1C: No results for input(s): HGBA1C in the last 72 hours. CBG: No results for input(s): GLUCAP in the last 168 hours. Lipid Profile: No results for input(s): CHOL, HDL, LDLCALC, TRIG, CHOLHDL, LDLDIRECT in the last 72 hours. Thyroid  Function Tests: No results for input(s): TSH, T4TOTAL, FREET4, T3FREE, THYROIDAB in the last 72 hours. Anemia Panel: No results for input(s): VITAMINB12, FOLATE, FERRITIN, TIBC, IRON, RETICCTPCT in the last 72 hours. Urine analysis:    Component Value Date/Time   COLORURINE YELLOW 09/08/2024 1729   APPEARANCEUR CLEAR 09/08/2024 1729   LABSPEC 1.014 09/08/2024 1729   PHURINE 5.0 09/08/2024 1729   GLUCOSEU 150 (A) 09/08/2024 1729   HGBUR NEGATIVE 09/08/2024 1729   BILIRUBINUR NEGATIVE 09/08/2024 1729   BILIRUBINUR negative 03/03/2024 1430   KETONESUR NEGATIVE 09/08/2024 1729   PROTEINUR NEGATIVE 09/08/2024 1729   UROBILINOGEN 0.2 03/03/2024 1430   NITRITE NEGATIVE 09/08/2024 1729   LEUKOCYTESUR NEGATIVE 09/08/2024 1729    Radiological Exams  on Admission: CT ABDOMEN PELVIS W CONTRAST Result Date: 09/08/2024 CLINICAL DATA:  Abdominal pain. EXAM: CT ABDOMEN AND PELVIS WITH CONTRAST TECHNIQUE: Multidetector CT imaging of the abdomen and pelvis was performed using the standard protocol following bolus administration of intravenous contrast. RADIATION DOSE REDUCTION: This exam  was performed according to the departmental dose-optimization program which includes automated exposure control, adjustment of the mA and/or kV according to patient size and/or use of iterative reconstruction technique. CONTRAST:  80mL OMNIPAQUE IOHEXOL 300 MG/ML  SOLN COMPARISON:  None Available. FINDINGS: Lower chest: Bibasilar linear atelectasis/scarring. No intra-abdominal free air or free fluid. Hepatobiliary: The liver is unremarkable. No biliary dilatation. The gallbladder is unremarkable with Pancreas: Unremarkable. No pancreatic ductal dilatation or surrounding inflammatory changes. Spleen: Normal in size without focal abnormality. Adrenals/Urinary Tract: The adrenal glands unremarkable. The kidneys, visualized ureters, and urinary bladder appear unremarkable. Stomach/Bowel: There is moderate stool throughout the colon. There is no bowel obstruction or active inflammation. The appendix is normal. Vascular/Lymphatic: Mild aortoiliac atherosclerotic disease. The IVC is unremarkable. No portal venous gas. There is no adenopathy. Reproductive: Prostate brachytherapy seeds. Other: None Musculoskeletal: Osteopenia with degenerative changes. No acute osseous pathology. IMPRESSION: 1. No acute intra-abdominal or pelvic pathology. 2. Moderate colonic stool burden. No bowel obstruction. Normal appendix. 3.  Aortic Atherosclerosis (ICD10-I70.0). Electronically Signed   By: Vanetta Chou M.D.   On: 09/08/2024 19:26   CT Head Wo Contrast Result Date: 09/08/2024 EXAM: CT HEAD WITHOUT 09/08/2024 07:15:29 PM TECHNIQUE: CT of the head was performed without the administration of  intravenous contrast. Automated exposure control, iterative reconstruction, and/or weight based adjustment of the mA/kV was utilized to reduce the radiation dose to as low as reasonably achievable. COMPARISON: None available. CLINICAL HISTORY: Head trauma, minor (Age >= 65y); Mental status change, unknown cause FINDINGS: BRAIN AND VENTRICLES: No acute intracranial hemorrhage. No mass effect or midline shift. No extra-axial fluid collection. No evidence of acute infarct. Patchy and confluent decreased attenuation throughout the deep and periventricular white matter of the cerebral hemispheres bilaterally, compatible with chronic microvascular ischemic disease. Cerebral ventricle sizes concordant with degree of cerebral volume loss. Atherosclerotic calcifications within the cavernous Internal Carotid and Vertebral Arteries. No hydrocephalus. ORBITS: Bilateral lens replacement. SINUSES AND MASTOIDS: No acute abnormality. SOFT TISSUES AND SKULL: No acute skull fracture. No acute soft tissue abnormality. IMPRESSION: 1. No acute intracranial abnormality. Electronically signed by: Morgane Naveau MD 09/08/2024 07:23 PM EST RP Workstation: [id number]   DG Chest Port 1 View Result Date: 09/08/2024 EXAM: 1 VIEW(S) XRAY OF THE CHEST 09/08/2024 05:52:00 PM COMPARISON: 02/10/2023 CLINICAL HISTORY: Questionable sepsis - evaluate for abnormality FINDINGS: LUNGS AND PLEURA: Minimal right mid lung opacity is noted concerning for subsegmental atelectasis or possibly infiltrate. No pleural effusion. No pneumothorax. HEART AND MEDIASTINUM: No acute abnormality of the cardiac and mediastinal silhouettes. BONES AND SOFT TISSUES: No acute osseous abnormality. IMPRESSION: 1. Minimal right mid lung opacity, likely subsegmental atelectasis versus infiltrate. Electronically signed by: Lynwood Seip MD 09/08/2024 06:00 PM EST RP Workstation: [id number]    EKG: Independently reviewed.  Normal sinus rhythm, QTc  430  Assessment/Plan Principal Problem:   Sepsis due to pneumonia [facility]) Active Problems:   Dementia (HCC)   Type 2 diabetes mellitus with diabetic neuropathy, unspecified (HCC)   Post-polio syndrome (HCC)   Chronic midline low back pain without sciatica   Other insomnia   Urinary urgency   Closed fracture of head of humerus, left, initial encounter     Sepsis present on admission, presented with leukocytosis, lactic acidosis and likely cause of right middle lobe pneumonia. Agree with admission given severity of symptoms. Antibiotics to treat bacterial pneumonia, given cefepime  and azithromycin  in the ER.  Will continue Rocephin and azithromycin . Chest physiotherapy, incentive spirometry, deep breathing exercises, sputum induction, mucolytic's and bronchodilators.  Participation will be challenging.  Will attempt as much we can. Sputum cultures, blood cultures, Legionella and streptococcal antigen. Supplemental oxygen to keep saturations more than 90%. Currently hemodynamically stable lactic acid responded to IV fluids.  Monitor.  2.  Dementia with behavioral disturbances, advanced dementia.  Acute metabolic encephalopathy in a patient with underlying dementia. Delirium precautions.  Fall precautions. Constipation management, started on scheduled Colace, MiraLAX , 1 dose of Dulcolax suppository now. PT OT. Focus on comfort with pain management, anxiety management and insomnia management. Okay to insert Foley catheter if needed. Klonopin  at night. Gabapentin  as scheduled. Hydroxyzine as needed for anxiety.  3.  Type 2 diabetes: On metformin  and Januvia .  Holding.  Keeping on low-dose sliding scale.  4.  Closed fracture of the head of humerus: On sling.  Orthopedics has seen.  Pain management.  Advised conservative treatment.   DVT prophylaxis: Lovenox  subcu Code Status: DNR with limited intervention.  Discussed at the bedside with patient's wife and son and daughter. Family  Communication: Wife at the bedside, son and daughter at the bedside. Disposition Plan: Unknown at this time. Consults called: None Admission status: Inpatient.  MedSurg bed.   Renato Applebaum MD [facility]

## 2024-09-08 NOTE — ED Triage Notes (Signed)
 Pt BIB EMS from home c/o AMS and lethargy starting this morning. Humerus fracture on Monday and has had elevated temps since then. Altered at baseline, but now more than ever according to in home caregiver. Received 400 ml of LR and has perked up since.   EMS vitals  BP 140/62 HR 80 SPO2 97 RA  CBG 299 Temp 101.6
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
## 2024-09-08 NOTE — Telephone Encounter (Signed)
 Copied from CRM #[id number]. Topic: Referral - Request for Referral >> Sep 08, 2024 11:09 AM Rosina BIRCH wrote: Did the patient discuss referral with their provider in the last year? Yes (If No - schedule appointment) (If Yes - send message)  Appointment offered? No  Type of order/referral and detailed reason for visit: hospice   Preference of office, provider, location: [facility] [phone]  If referral order, have you been seen by this specialty before? No (If Yes, this issue or another issue? When? Where?  Can we respond through MyChart? Yes CB [phone]
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
## 2024-09-09 ENCOUNTER — Encounter: Payer: Self-pay | Admitting: Nurse Practitioner

## 2024-09-09 DIAGNOSIS — G301 Alzheimer's disease with late onset: Secondary | ICD-10-CM

## 2024-09-09 DIAGNOSIS — F02C18 Dementia in other diseases classified elsewhere, severe, with other behavioral disturbance: Secondary | ICD-10-CM

## 2024-09-09 LAB — GLUCOSE, CAPILLARY
Glucose-Capillary: 257 mg/dL — ABNORMAL HIGH (ref 70–99)
Glucose-Capillary: 212 mg/dL — ABNORMAL HIGH (ref 70–99)
Glucose-Capillary: 127 mg/dL — ABNORMAL HIGH (ref 70–99)
Glucose-Capillary: 216 mg/dL — ABNORMAL HIGH (ref 70–99)

## 2024-09-09 LAB — CBC
HCT: 35 % — ABNORMAL LOW (ref 39.0–52.0)
Hemoglobin: 11.7 g/dL — ABNORMAL LOW (ref 13.0–17.0)
MCH: 30.4 pg (ref 26.0–34.0)
MCHC: 33.4 g/dL (ref 30.0–36.0)
MCV: 90.9 fL (ref 80.0–100.0)
Platelets: 349 K/uL (ref 150–400)
RBC: 3.85 MIL/uL — ABNORMAL LOW (ref 4.22–5.81)
RDW: 14 % (ref 11.5–15.5)
WBC: 16.1 K/uL — ABNORMAL HIGH (ref 4.0–10.5)
nRBC: 0 % (ref 0.0–0.2)

## 2024-09-09 LAB — BASIC METABOLIC PANEL WITH GFR
Anion gap: 14 (ref 5–15)
BUN: 27 mg/dL — ABNORMAL HIGH (ref 8–23)
CO2: 20 mmol/L — ABNORMAL LOW (ref 22–32)
Calcium: 9.1 mg/dL (ref 8.9–10.3)
Chloride: 102 mmol/L (ref 98–111)
Creatinine, Ser: 1.31 mg/dL — ABNORMAL HIGH (ref 0.61–1.24)
GFR, Estimated: 53 mL/min — ABNORMAL LOW (ref >60)
Glucose, Bld: 253 mg/dL — ABNORMAL HIGH (ref 70–99)
Potassium: 4.8 mmol/L (ref 3.5–5.1)
Sodium: 136 mmol/L (ref 135–145)

## 2024-09-09 LAB — STREP PNEUMONIAE URINARY ANTIGEN: Strep Pneumo Urinary Antigen: NEGATIVE

## 2024-09-09 LAB — HIV ANTIBODY (ROUTINE TESTING W REFLEX): HIV Screen 4th Generation wRfx: NONREACTIVE

## 2024-09-09 LAB — PROCALCITONIN: Procalcitonin: 0.19 ng/mL

## 2024-09-09 MED ORDER — SODIUM CHLORIDE 0.45 % IV SOLN: INTRAVENOUS | Status: AC

## 2024-09-09 MED ORDER — CHLORHEXIDINE GLUCONATE CLOTH 2 % EX PADS
6.0000 | MEDICATED_PAD | Freq: Every day | CUTANEOUS | Status: DC
  Administered 2024-09-09 – 2024-09-16 (×7): 6 via TOPICAL

## 2024-09-09 NOTE — Plan of Care (Signed)
   Problem: Coping: Goal: Ability to adjust to condition or change in health will improve Outcome: Progressing   Problem: Fluid Volume: Goal: Ability to maintain a balanced intake and output will improve Outcome: Progressing
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
## 2024-09-09 NOTE — Plan of Care (Signed)

## 2024-09-09 NOTE — TOC Progression Note (Signed)
 Transition of Care [facility]) - Progression Note    Patient Details  Name: Jeffery Stewart MRN: [id number] Date of Birth: [birth year]  [facility] CM/SW Contact  Doneta Glenys DASEN, RN Phone Number: 09/09/2024, 4:25 PM  Clinical Narrative:    CM called patients wife Avelina [phone], left message.                     Expected Discharge Plan and Services                                               Social Drivers of Health (SDOH) Interventions SDOH Screenings   Food Insecurity: No Food Insecurity (09/08/2024)  Housing: Unknown (09/08/2024)  Transportation Needs: No Transportation Needs (09/08/2024)  Utilities: Not At Risk (09/08/2024)  Depression (PHQ2-9): High Risk (04/05/2024)  Social Connections: Unknown (09/08/2024)  Tobacco Use: Low Risk (09/08/2024)    Readmission Risk Interventions     No data to display

## 2024-09-09 NOTE — TOC Initial Note (Signed)
 Transition of Care [facility]) - Initial/Assessment Note    Patient Details  Name: Jeffery Stewart MRN: [id number] Date of Birth: 1935/12/26  Transition of [facility]) CM/SW Contact:    Doneta Glenys DASEN, RN Phone Number: 09/09/2024, 4:58 PM  Clinical Narrative:                 Home; IP CM will follow for PT recommendation.  Expected Discharge Plan: Home w Home Health Services Barriers to Discharge: Continued Medical Work up   Patient Goals and CMS Choice Patient states their goals for this hospitalization and ongoing recovery are:: Home per spouse CMS Medicare.gov Compare Post Acute Care list provided to:: Patient Represenative (must comment) Choice offered to / list presented to : Spouse Lillian ownership interest in [facility].provided to:: Spouse    Expected Discharge Plan and Services In-house Referral: NA Discharge Planning Services: CM Consult   Living arrangements for the past 2 months: Single Family Home                 DME Arranged: N/A DME Agency: NA       HH Arranged: NA HH Agency: NA        Prior Living Arrangements/Services Living arrangements for the past 2 months: Single Family Home Lives with:: Spouse Patient language and need for interpreter reviewed:: Yes Do you feel safe going back to the place where you live?: Yes      Need for Family Participation in Patient Care: Yes (Comment) Care giver support system in place?: Yes (comment) Current home services: DME (cane,walker,rollator, WC) Criminal Activity/Legal Involvement Pertinent to Current Situation/Hospitalization: No - Comment as needed  Activities of Daily Living   ADL Screening (condition at time of admission) Independently performs ADLs?: Yes (appropriate for developmental age) Is the patient deaf or have difficulty hearing?: No Does the patient have difficulty seeing, even when wearing glasses/contacts?: No Does the patient have difficulty concentrating, remembering, or making  decisions?: Yes  Permission Sought/Granted Permission sought to share information with : Case Manager Permission granted to share information with : Yes, Verbal Permission Granted  Share Information with NAME: Downard,Patricia  Spouse, Emergency Contact  [phone]           Emotional Assessment Appearance:: Appears stated age Attitude/Demeanor/Rapport: Unable to Assess Affect (typically observed): Unable to Assess Orientation: : Oriented to Self Alcohol / Substance Use: Not Applicable Psych Involvement: No (comment)  Admission diagnosis:  Somnolence [R40.0] Sepsis due to pneumonia (HCC) [J18.9, A41.9] Pneumonia of right middle lobe due to infectious organism [J18.9] Other closed displaced fracture of proximal end of left humerus with routine healing, subsequent encounter [S42.292D] Patient Active Problem List   Diagnosis Date Noted   Sepsis due to pneumonia (HCC) 09/08/2024   Closed fracture of head of humerus, left, initial encounter 09/08/2024   Urinary urgency 07/26/2024   Bilateral leg edema 06/28/2024   Vitamin D  insufficiency 06/28/2024   Fall 06/01/2024   Gait instability 04/29/2024   Nocturia 04/29/2024   Other insomnia 04/29/2024   Routine general medical examination at a health care facility 04/05/2024   Long term current use of oral hypoglycemic drug 03/03/2024   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   Chronic midline low back pain without sciatica 03/03/2024   Glaucoma of both eyes 03/03/2024   History of prostate cancer 03/03/2024   Angina pectoris 02/10/2023   Type 2 diabetes mellitus with diabetic neuropathy, unspecified (HCC) 02/10/2023   Dyslipidemia 02/10/2023   Dementia (HCC) 2012   PCP:  Nedra,  Tinnie LABOR, NP Pharmacy:   [facility] [phone] [place], [place] - [address] [place] [address] Phone: [phone] Fax: [phone]     Social Drivers of Health (SDOH) Social History: SDOH  Screenings   Food Insecurity: No Food Insecurity (09/08/2024)  Housing: Unknown (09/08/2024)  Transportation Needs: No Transportation Needs (09/08/2024)  Utilities: Not At Risk (09/08/2024)  Depression (PHQ2-9): High Risk (04/05/2024)  Social Connections: Unknown (09/08/2024)  Tobacco Use: Low Risk (09/08/2024)   SDOH Interventions:     Readmission Risk Interventions    09/09/2024    4:56 PM  Readmission Risk Prevention Plan  Transportation Screening Complete  PCP or Specialist Appt within 5-7 Days Complete  Home Care Screening Complete  Medication Review (RN CM) Complete

## 2024-09-09 NOTE — Progress Notes (Signed)
 [facility] PROGRESS NOTE   Jeffery Stewart FMW:[id number] DOB: December 11, 1935 DOA: 09/08/2024  PCP: Nedra Tinnie LABOR, NP  Brief History: 88 y.o. male with medical history significant of advanced dementia, type 2 diabetes, neuropathy, postpolio syndrome who lives at home with his wife.  He had a mechanical fall 2 days prior to admission and suffered left humerus fracture.  Was seen in the emergency room and sent home with sling.  Patient went to see orthopedics today for admission, was advised conservative management.  Patient does have restlessness, anxiety at baseline.  Patient was brought in due to increased lethargy and low-grade fever.  Patient was suspected of having pneumonia.  He was hospitalized for further management.    Consultants: None  Procedures: None    Subjective/Interval History: Patient somnolent.  Opens his eyes but does not really communicate.  His wife and daughter are at the bedside.    Assessment/Plan:  Pneumonia, possibly aspiration/sepsis, present on admission SLP evaluation. Continue with ceftriaxone and azithromycin .  He was given cefepime  in the ED. Follow-up on blood cultures.  Patient without any respiratory symptoms. UA was noted to be unremarkable.  COVID-19, influenza and RSV PCR negative.  WBC was noted to be elevated.  Procalcitonin 0.19.  Lactic acid was elevated at 3.3 and then improved to 2.9.  CT of the abdomen pelvis did not show any acute findings.  Constipation was suggested.  History of advanced dementia with behavioral disturbance/acute metabolic encephalopathy Worsening mentation possibly due to infection. CT head did not show any acute findings. Continue to monitor for now.  Diabetes mellitus type 2 Holding oral medications.  Monitor CBGs.  Left humeral fracture Seen by orthopedics recently.  Continue with sling.   DVT Prophylaxis: Lovenox  Code Status: DNR Family Communication: Discussed with patient's wife and  daughter Disposition Plan: Hopefully return home when improved    Medications: Scheduled:  acetaminophen   650 mg Rectal Once   azithromycin   500 mg Oral Daily   cholecalciferol  2,000 Units Oral Daily   clonazePAM   1 mg Oral QHS   vitamin B-12  2,000 mcg Oral Daily   cycloSPORINE   1 drop Both Eyes BID   docusate sodium   100 mg Oral BID   DULoxetine   60 mg Oral Daily   enoxaparin  (LOVENOX ) injection  40 mg Subcutaneous Q24H   famotidine   20 mg Oral BID   gabapentin   300 mg Oral TID   insulin  aspart  0-6 Units Subcutaneous TID WC   polyethylene glycol  17 g Oral Daily   zolpidem   5 mg Oral QHS   Continuous:  sodium chloride  75 mL/hr at 09/09/24 1026   cefTRIAXone (ROCEPHIN)  IV 2 g (09/09/24 0558)   PRN:acetaminophen  **OR** acetaminophen , hydrOXYzine, lidocaine, morphine  injection, nitroGLYCERIN , ondansetron  **OR** ondansetron  (ZOFRAN ) IV, oxyCODONE   Antibiotics: Anti-infectives (From admission, onward)    Start     Dose/Rate Route Frequency Ordered Stop   09/09/24 1800  azithromycin  (ZITHROMAX ) tablet 500 mg        500 mg Oral Daily 09/08/24 2044 09/14/24 0959   09/09/24 0600  cefTRIAXone (ROCEPHIN) 2 g in sodium chloride  0.9 % 100 mL IVPB        2 g 200 mL/hr over 30 Minutes Intravenous Every 24 hours 09/08/24 2044 09/14/24 0559   09/08/24 1900  azithromycin  (ZITHROMAX ) 500 mg in sodium chloride  0.9 % 250 mL IVPB        500 mg 250 mL/hr over 60 Minutes Intravenous  Once 09/08/24 1849 09/08/24 2102  09/08/24 1700  ceFEPIme  (MAXIPIME ) 2 g in sodium chloride  0.9 % 100 mL IVPB        2 g 200 mL/hr over 30 Minutes Intravenous  Once 09/08/24 1655 09/08/24 1801       Objective:  Vital Signs  Vitals:   09/09/24 0442 09/09/24 0544 09/09/24 0556 09/09/24 1108  BP:   (!) 142/60 (!) 139/102  Pulse:   68 75  Resp:   16   Temp: 99.1 F (37.3 C) 98.5 F (36.9 C)  97.8 F (36.6 C)  TempSrc: Axillary Axillary  Axillary  SpO2:   97% 95%  Weight:      Height:         Intake/Output Summary (Last 24 hours) at 09/09/2024 1244 Last data filed at 09/09/2024 0320 Gross per 24 hour  Intake 94 ml  Output 1000 ml  Net -906 ml   Filed Weights   09/08/24 1621  Weight: 67.5 kg    General appearance: Somnolent but arousable.  Not very communicative. Resp: Clear to auscultation bilaterally.  Normal effort Cardio: S1-S2 is normal regular.  No S3-S4.  No rubs murmurs or bruit GI: Abdomen is soft.  Nontender nondistended.  Bowel sounds are present normal.  No masses organomegaly Extremities: No edema.  Neurologic:  No focal neurological deficits.    Lab Results:  Data Reviewed: I have personally reviewed following labs and reports of the imaging studies  CBC: Recent Labs  Lab 09/08/24 1638 09/09/24 0048  WBC 14.9* 16.1*  NEUTROABS 13.0*  --   HGB 11.4* 11.7*  HCT 35.6* 35.0*  MCV 92.2 90.9  PLT 301 349    Basic Metabolic Panel: Recent Labs  Lab 09/08/24 1638 09/09/24 0048  NA 135 136  K 5.3* 4.8  CL 99 102  CO2 24 20*  GLUCOSE 292* 253*  BUN 31* 27*  CREATININE 1.44* 1.31*  CALCIUM  9.1 9.1    GFR: Estimated Creatinine Clearance: 36.5 mL/min (A) (by C-G formula based on SCr of 1.31 mg/dL (H)).  Liver Function Tests: Recent Labs  Lab 09/08/24 1638  AST 14*  ALT 6  ALKPHOS 55  BILITOT 0.3  PROT 6.4*  ALBUMIN 3.7    Coagulation Profile: Recent Labs  Lab 09/08/24 1638  INR 1.0    BNP (last 3 results) Recent Labs    06/28/24 1619  PROBNP 44.0    CBG: Recent Labs  Lab 09/08/24 2232 09/09/24 0725 09/09/24 1126  GLUCAP 200* 257* 212*     Recent Results (from the past 240 hours)  Culture, blood (Routine x 2)     Status: None (Preliminary result)   Collection Time: 09/08/24  4:29 PM   Specimen: BLOOD  Result Value Ref Range Status   Specimen Description   Final    BLOOD RIGHT ANTECUBITAL Performed at [facility], [address], [place], [place] [address]    Special Requests   Final     BOTTLES DRAWN AEROBIC AND ANAEROBIC Blood Culture results may not be optimal due to an inadequate volume of blood received in culture bottles Performed at [facility], [address], [place], [place] [address]    Culture   Final    NO GROWTH < 24 HOURS Performed at [facility], [address], [place], [place] [address]    Report Status PENDING  Incomplete  Resp panel by RT-PCR (RSV, Flu A&B, Covid) Anterior Nasal Swab     Status: None   Collection Time: 09/08/24  6:26 PM   Specimen: Anterior Nasal Swab  Result Value Ref Range Status   SARS Coronavirus 2 by RT PCR NEGATIVE NEGATIVE Final    Comment: (NOTE) SARS-CoV-2 target nucleic acids are NOT DETECTED.  The SARS-CoV-2 RNA is generally detectable in upper respiratory specimens during the acute phase of infection. The lowest concentration of SARS-CoV-2 viral copies this assay can detect is 138 copies/mL. A negative result does not preclude SARS-Cov-2 infection and should not be used as the sole basis for treatment or other patient management decisions. A negative result may occur with  improper specimen collection/handling, submission of specimen other than nasopharyngeal swab, presence of viral mutation(s) within the areas targeted by this assay, and inadequate number of viral copies(<138 copies/mL). A negative result must be combined with clinical observations, patient history, and epidemiological information. The expected result is Negative.  Fact Sheet for Patients:  bloggercourse.com  Fact Sheet for Healthcare Providers:  seriousbroker.it  This test is no t yet approved or cleared by the [place]  FDA and  has been authorized for detection and/or diagnosis of SARS-CoV-2 by FDA under an Emergency Use Authorization (EUA). This EUA will remain  in effect (meaning this test can be used) for the duration of the COVID-19 declaration under  Section 564(b)(1) of the Act, 21 U.S.C.section 360bbb-3(b)(1), unless the authorization is terminated  or revoked sooner.       Influenza A by PCR NEGATIVE NEGATIVE Final   Influenza B by PCR NEGATIVE NEGATIVE Final    Comment: (NOTE) The Xpert Xpress SARS-CoV-2/FLU/RSV plus assay is intended as an aid in the diagnosis of influenza from Nasopharyngeal swab specimens and should not be used as a sole basis for treatment. Nasal washings and aspirates are unacceptable for Xpert Xpress SARS-CoV-2/FLU/RSV testing.  Fact Sheet for Patients: bloggercourse.com  Fact Sheet for Healthcare Providers: seriousbroker.it  This test is not yet approved or cleared by the [place]  FDA and has been authorized for detection and/or diagnosis of SARS-CoV-2 by FDA under an Emergency Use Authorization (EUA). This EUA will remain in effect (meaning this test can be used) for the duration of the COVID-19 declaration under Section 564(b)(1) of the Act, 21 U.S.C. section 360bbb-3(b)(1), unless the authorization is terminated or revoked.     Resp Syncytial Virus by PCR NEGATIVE NEGATIVE Final    Comment: (NOTE) Fact Sheet for Patients: bloggercourse.com  Fact Sheet for Healthcare Providers: seriousbroker.it  This test is not yet approved or cleared by the [place]  FDA and has been authorized for detection and/or diagnosis of SARS-CoV-2 by FDA under an Emergency Use Authorization (EUA). This EUA will remain in effect (meaning this test can be used) for the duration of the COVID-19 declaration under Section 564(b)(1) of the Act, 21 U.S.C. section 360bbb-3(b)(1), unless the authorization is terminated or revoked.  Performed at [facility], [address] [place], [place] [address]   Culture, blood (Routine x 2)     Status: None (Preliminary result)   Collection Time:  09/09/24 12:48 AM   Specimen: BLOOD RIGHT ARM  Result Value Ref Range Status   Specimen Description   Final    BLOOD RIGHT ARM Performed at [facility], [address], [place], [place] [address]    Special Requests   Final    BOTTLES DRAWN AEROBIC AND ANAEROBIC Blood Culture results may not be optimal due to an inadequate volume of blood received in culture bottles Performed at [facility], [address]  [place], [place] [address]    Culture   Final    NO GROWTH < 12 HOURS Performed at [facility], [address] [place], [place] [address]    Report Status PENDING  Incomplete      Radiology Studies: CT ABDOMEN PELVIS W CONTRAST Result Date: 09/08/2024 CLINICAL DATA:  Abdominal pain. EXAM: CT ABDOMEN AND PELVIS WITH CONTRAST TECHNIQUE: Multidetector CT imaging of the abdomen and pelvis was performed using the standard protocol following bolus administration of intravenous contrast. RADIATION DOSE REDUCTION: This exam was performed according to the departmental dose-optimization program which includes automated exposure control, adjustment of the mA and/or kV according to patient size and/or use of iterative reconstruction technique. CONTRAST:  80mL OMNIPAQUE IOHEXOL 300 MG/ML  SOLN COMPARISON:  None Available. FINDINGS: Lower chest: Bibasilar linear atelectasis/scarring. No intra-abdominal free air or free fluid. Hepatobiliary: The liver is unremarkable. No biliary dilatation. The gallbladder is unremarkable with Pancreas: Unremarkable. No pancreatic ductal dilatation or surrounding inflammatory changes. Spleen: Normal in size without focal abnormality. Adrenals/Urinary Tract: The adrenal glands unremarkable. The kidneys, visualized ureters, and urinary bladder appear unremarkable. Stomach/Bowel: There is moderate stool throughout the colon. There is no bowel obstruction or active inflammation. The appendix is normal. Vascular/Lymphatic: Mild aortoiliac  atherosclerotic disease. The IVC is unremarkable. No portal venous gas. There is no adenopathy. Reproductive: Prostate brachytherapy seeds. Other: None Musculoskeletal: Osteopenia with degenerative changes. No acute osseous pathology. IMPRESSION: 1. No acute intra-abdominal or pelvic pathology. 2. Moderate colonic stool burden. No bowel obstruction. Normal appendix. 3.  Aortic Atherosclerosis (ICD10-I70.0). Electronically Signed   By: Vanetta Chou M.D.   On: 09/08/2024 19:26   CT Head Wo Contrast Result Date: 09/08/2024 EXAM: CT HEAD WITHOUT 09/08/2024 07:15:29 PM TECHNIQUE: CT of the head was performed without the administration of intravenous contrast. Automated exposure control, iterative reconstruction, and/or weight based adjustment of the mA/kV was utilized to reduce the radiation dose to as low as reasonably achievable. COMPARISON: None available. CLINICAL HISTORY: Head trauma, minor (Age >= 65y); Mental status change, unknown cause FINDINGS: BRAIN AND VENTRICLES: No acute intracranial hemorrhage. No mass effect or midline shift. No extra-axial fluid collection. No evidence of acute infarct. Patchy and confluent decreased attenuation throughout the deep and periventricular white matter of the cerebral hemispheres bilaterally, compatible with chronic microvascular ischemic disease. Cerebral ventricle sizes concordant with degree of cerebral volume loss. Atherosclerotic calcifications within the cavernous Internal Carotid and Vertebral Arteries. No hydrocephalus. ORBITS: Bilateral lens replacement. SINUSES AND MASTOIDS: No acute abnormality. SOFT TISSUES AND SKULL: No acute skull fracture. No acute soft tissue abnormality. IMPRESSION: 1. No acute intracranial abnormality. Electronically signed by: Morgane Naveau MD 09/08/2024 07:23 PM EST RP Workstation: [id number]   DG Chest Port 1 View Result Date: 09/08/2024 EXAM: 1 VIEW(S) XRAY OF THE CHEST 09/08/2024 05:52:00 PM COMPARISON: 02/10/2023 CLINICAL  HISTORY: Questionable sepsis - evaluate for abnormality FINDINGS: LUNGS AND PLEURA: Minimal right mid lung opacity is noted concerning for subsegmental atelectasis or possibly infiltrate. No pleural effusion. No pneumothorax. HEART AND MEDIASTINUM: No acute abnormality of the cardiac and mediastinal silhouettes. BONES AND SOFT TISSUES: No acute osseous abnormality. IMPRESSION: 1. Minimal right mid lung opacity, likely subsegmental atelectasis versus infiltrate. Electronically signed by: Lynwood Seip MD 09/08/2024 06:00 PM EST RP Workstation: [id number]       LOS: 1 day   Joette Pebbles  [facility] Pager on [email or web address]  09/09/2024, 12:44 PM

## 2024-09-10 ENCOUNTER — Other Ambulatory Visit (HOSPITAL_COMMUNITY): Payer: Self-pay

## 2024-09-10 LAB — GLUCOSE, CAPILLARY
Glucose-Capillary: 219 mg/dL — ABNORMAL HIGH (ref 70–99)
Glucose-Capillary: 193 mg/dL — ABNORMAL HIGH (ref 70–99)
Glucose-Capillary: 298 mg/dL — ABNORMAL HIGH (ref 70–99)
Glucose-Capillary: 190 mg/dL — ABNORMAL HIGH (ref 70–99)

## 2024-09-10 LAB — BASIC METABOLIC PANEL WITH GFR
Anion gap: 8 (ref 5–15)
BUN: 24 mg/dL — ABNORMAL HIGH (ref 8–23)
CO2: 27 mmol/L (ref 22–32)
Calcium: 8.7 mg/dL — ABNORMAL LOW (ref 8.9–10.3)
Chloride: 102 mmol/L (ref 98–111)
Creatinine, Ser: 1.32 mg/dL — ABNORMAL HIGH (ref 0.61–1.24)
GFR, Estimated: 53 mL/min — ABNORMAL LOW (ref >60)
Glucose, Bld: 191 mg/dL — ABNORMAL HIGH (ref 70–99)
Potassium: 4.7 mmol/L (ref 3.5–5.1)
Sodium: 137 mmol/L (ref 135–145)

## 2024-09-10 LAB — CBC
HCT: 33.1 % — ABNORMAL LOW (ref 39.0–52.0)
Hemoglobin: 10.9 g/dL — ABNORMAL LOW (ref 13.0–17.0)
MCH: 30.4 pg (ref 26.0–34.0)
MCHC: 32.9 g/dL (ref 30.0–36.0)
MCV: 92.5 fL (ref 80.0–100.0)
Platelets: 310 K/uL (ref 150–400)
RBC: 3.58 MIL/uL — ABNORMAL LOW (ref 4.22–5.81)
RDW: 13.9 % (ref 11.5–15.5)
WBC: 9.4 K/uL (ref 4.0–10.5)
nRBC: 0 % (ref 0.0–0.2)

## 2024-09-10 LAB — MAGNESIUM: Magnesium: 2.3 mg/dL (ref 1.7–2.4)

## 2024-09-10 MED ORDER — PHENAZOPYRIDINE HCL 100 MG PO TABS
100.0000 mg | ORAL_TABLET | Freq: Three times a day (TID) | ORAL | Status: DC
  Administered 2024-09-10: 100 mg via ORAL
  Filled 2024-09-10 (×2): qty 1

## 2024-09-10 MED ORDER — SODIUM CHLORIDE 0.45 % IV SOLN: INTRAVENOUS | Status: DC

## 2024-09-10 MED ORDER — TAMSULOSIN HCL 0.4 MG PO CAPS
0.4000 mg | ORAL_CAPSULE | Freq: Every day | ORAL | Status: DC
  Administered 2024-09-10 – 2024-09-16 (×7): 0.4 mg via ORAL
  Filled 2024-09-10 (×7): qty 1

## 2024-09-10 NOTE — Progress Notes (Signed)
 [facility] PROGRESS NOTE   Jeffery Stewart FMW:[id number] DOB: [birth year] DOA: 09/08/2024  PCP: Nedra Tinnie LABOR, NP  Brief History: 88 y.o. male with medical history significant of advanced dementia, type 2 diabetes, neuropathy, postpolio syndrome who lives at home with his wife.  He had a mechanical fall 2 days prior to admission and suffered left humerus fracture.  Was seen in the emergency room and sent home with sling.  Patient went to see orthopedics today for admission, was advised conservative management.  Patient does have restlessness, anxiety at baseline.  Patient was brought in due to increased lethargy and low-grade fever.  Patient was suspected of having pneumonia.  He was hospitalized for further management.    Consultants: None  Procedures: None  Subjective/Interval History: Patient is noted to be much more awake and alert this morning compared to yesterday.  His wife and daughter are at the bedside.  Patient however still remains confused and distracted.  However he is close to his baseline per his family.   Assessment/Plan:  Pneumonia, possibly aspiration/sepsis, present on admission Patient was started on ceftriaxone and azithromycin . Cultures are negative so far.  Concern for aspiration.  SLP evaluation. UA was noted to be unremarkable.  COVID-19, influenza and RSV PCR negative.  WBC was noted to be elevated.  Procalcitonin 0.19.  Lactic acid was elevated at 3.3 and then improved to 2.9.  CT of the abdomen pelvis did not show any acute findings.  Constipation was suggested. Respiratory status is stable.  WBC has improved to 9.4. Continue with IV antibiotics for another day and then anticipate transition to Augmentin tomorrow.  History of advanced dementia with behavioral disturbance/acute metabolic encephalopathy Worsening mentation possibly due to infection. CT head did not show any acute findings. Mentation has improved with treatment of the  pneumonia.  Acute urinary retention Most likely due to acute illness and generalized weakness.  Foley catheter had to be inserted yesterday.  Will initiate Flomax  today.  Mobilize.  Could attempt a voiding trial tomorrow.  Diabetes mellitus type 2 Monitor CBGs.  SSI.  Chronic kidney disease stage IIIb Renal function close to baseline.  Continue to monitor.  Avoid nephrotoxic agents  Normocytic anemia Drop in hemoglobin is dilutional.  No evidence of overt bleeding.  Left humeral fracture Seen by orthopedics recently.  Conservative management.  Continue with sling.   DVT Prophylaxis: Lovenox  Code Status: DNR Family Communication: Discussed with patient's wife and daughter Disposition Plan: Home return home when improved.  PT OT eval.  Mobilize.    Medications: Scheduled:  acetaminophen   650 mg Rectal Once   azithromycin   500 mg Oral Daily   Chlorhexidine Gluconate Cloth  6 each Topical Daily   cholecalciferol  2,000 Units Oral Daily   clonazePAM   1 mg Oral QHS   vitamin B-12  2,000 mcg Oral Daily   cycloSPORINE   1 drop Both Eyes BID   docusate sodium   100 mg Oral BID   DULoxetine   60 mg Oral Daily   enoxaparin  (LOVENOX ) injection  40 mg Subcutaneous Q24H   famotidine   20 mg Oral BID   gabapentin   300 mg Oral TID   insulin  aspart  0-6 Units Subcutaneous TID WC   polyethylene glycol  17 g Oral Daily   tamsulosin   0.4 mg Oral Daily   zolpidem   5 mg Oral QHS   Continuous:  cefTRIAXone (ROCEPHIN)  IV 2 g (09/10/24 0502)   PRN:acetaminophen  **OR** acetaminophen , hydrOXYzine, lidocaine, morphine  injection, nitroGLYCERIN , ondansetron  **OR** ondansetron  (  ZOFRAN ) IV, oxyCODONE   Antibiotics: Anti-infectives (From admission, onward)    Start     Dose/Rate Route Frequency Ordered Stop   09/09/24 1800  azithromycin  (ZITHROMAX ) tablet 500 mg        500 mg Oral Daily 09/08/24 2044 09/14/24 0959   09/09/24 0600  cefTRIAXone (ROCEPHIN) 2 g in sodium chloride  0.9 % 100 mL IVPB         2 g 200 mL/hr over 30 Minutes Intravenous Every 24 hours 09/08/24 2044 09/14/24 0559   09/08/24 1900  azithromycin  (ZITHROMAX ) 500 mg in sodium chloride  0.9 % 250 mL IVPB        500 mg 250 mL/hr over 60 Minutes Intravenous  Once 09/08/24 1849 09/08/24 2102   09/08/24 1700  ceFEPIme  (MAXIPIME ) 2 g in sodium chloride  0.9 % 100 mL IVPB        2 g 200 mL/hr over 30 Minutes Intravenous  Once 09/08/24 1655 09/08/24 1801       Objective:  Vital Signs  Vitals:   09/09/24 0556 09/09/24 1108 09/09/24 2212 09/10/24 0438  BP: (!) 142/60 (!) 139/102 121/61 133/67  Pulse: 68 75 66 74  Resp: 16  18 18   Temp:  97.8 F (36.6 C) 98.4 F (36.9 C) 98.5 F (36.9 C)  TempSrc:  Axillary Oral   SpO2: 97% 95% 99% 100%  Weight:      Height:        Intake/Output Summary (Last 24 hours) at 09/10/2024 1133 Last data filed at 09/10/2024 0200 Gross per 24 hour  Intake 60 ml  Output 625 ml  Net -565 ml   Filed Weights   09/08/24 1621  Weight: 67.5 kg    General appearance: Noted to be awake and alert this morning. Resp: Clear to auscultation bilaterally.  Normal effort Cardio: S1-S2 is normal regular.  No S3-S4.  No rubs murmurs or bruit GI: Abdomen is soft.  Nontender nondistended.  Bowel sounds are present normal.  No masses organomegaly Extremities: No edema.  Full range of motion of lower extremities. Neurologic:  No focal neurological deficits.     Lab Results:  Data Reviewed: I have personally reviewed following labs and reports of the imaging studies  CBC: Recent Labs  Lab 09/08/24 1638 09/09/24 0048 09/10/24 0917  WBC 14.9* 16.1* 9.4  NEUTROABS 13.0*  --   --   HGB 11.4* 11.7* 10.9*  HCT 35.6* 35.0* 33.1*  MCV 92.2 90.9 92.5  PLT 301 349 310    Basic Metabolic Panel: Recent Labs  Lab 09/08/24 1638 09/09/24 0048 09/10/24 0550  NA 135 136 137  K 5.3* 4.8 4.7  CL 99 102 102  CO2 24 20* 27  GLUCOSE 292* 253* 191*  BUN 31* 27* 24*  CREATININE 1.44* 1.31* 1.32*   CALCIUM  9.1 9.1 8.7*  MG  --   --  2.3    GFR: Estimated Creatinine Clearance: 36.3 mL/min (A) (by C-G formula based on SCr of 1.32 mg/dL (H)).  Liver Function Tests: Recent Labs  Lab 09/08/24 1638  AST 14*  ALT 6  ALKPHOS 55  BILITOT 0.3  PROT 6.4*  ALBUMIN 3.7    Coagulation Profile: Recent Labs  Lab 09/08/24 1638  INR 1.0    BNP (last 3 results) Recent Labs    06/28/24 1619  PROBNP 44.0    CBG: Recent Labs  Lab 09/09/24 0725 09/09/24 1126 09/09/24 1625 09/09/24 2224 09/10/24 0751  GLUCAP 257* 212* 127* 216* 219*     Recent  Results (from the past 240 hours)  Culture, blood (Routine x 2)     Status: None (Preliminary result)   Collection Time: 09/08/24  4:29 PM   Specimen: BLOOD  Result Value Ref Range Status   Specimen Description   Final    BLOOD RIGHT ANTECUBITAL Performed at [facility], [address], [place], [place] [address]    Special Requests   Final    BOTTLES DRAWN AEROBIC AND ANAEROBIC Blood Culture results may not be optimal due to an inadequate volume of blood received in culture bottles Performed at [facility], [address] [place], [place] [address]    Culture   Final    NO GROWTH 2 DAYS Performed at [facility], [address], [place], [place] [address]    Report Status PENDING  Incomplete  Resp panel by RT-PCR (RSV, Flu A&B, Covid) Anterior Nasal Swab     Status: None   Collection Time: 09/08/24  6:26 PM   Specimen: Anterior Nasal Swab  Result Value Ref Range Status   SARS Coronavirus 2 by RT PCR NEGATIVE NEGATIVE Final    Comment: (NOTE) SARS-CoV-2 target nucleic acids are NOT DETECTED.  The SARS-CoV-2 RNA is generally detectable in upper respiratory specimens during the acute phase of infection. The lowest concentration of SARS-CoV-2 viral copies this assay can detect is 138 copies/mL. A negative result does not preclude SARS-Cov-2 infection and should not be used as  the sole basis for treatment or other patient management decisions. A negative result may occur with  improper specimen collection/handling, submission of specimen other than nasopharyngeal swab, presence of viral mutation(s) within the areas targeted by this assay, and inadequate number of viral copies(<138 copies/mL). A negative result must be combined with clinical observations, patient history, and epidemiological information. The expected result is Negative.  Fact Sheet for Patients:  bloggercourse.com  Fact Sheet for Healthcare Providers:  seriousbroker.it  This test is no t yet approved or cleared by the [place]  FDA and  has been authorized for detection and/or diagnosis of SARS-CoV-2 by FDA under an Emergency Use Authorization (EUA). This EUA will remain  in effect (meaning this test can be used) for the duration of the COVID-19 declaration under Section 564(b)(1) of the Act, 21 U.S.C.section 360bbb-3(b)(1), unless the authorization is terminated  or revoked sooner.       Influenza A by PCR NEGATIVE NEGATIVE Final   Influenza B by PCR NEGATIVE NEGATIVE Final    Comment: (NOTE) The Xpert Xpress SARS-CoV-2/FLU/RSV plus assay is intended as an aid in the diagnosis of influenza from Nasopharyngeal swab specimens and should not be used as a sole basis for treatment. Nasal washings and aspirates are unacceptable for Xpert Xpress SARS-CoV-2/FLU/RSV testing.  Fact Sheet for Patients: bloggercourse.com  Fact Sheet for Healthcare Providers: seriousbroker.it  This test is not yet approved or cleared by the [place]  FDA and has been authorized for detection and/or diagnosis of SARS-CoV-2 by FDA under an Emergency Use Authorization (EUA). This EUA will remain in effect (meaning this test can be used) for the duration of the COVID-19 declaration under Section 564(b)(1) of  the Act, 21 U.S.C. section 360bbb-3(b)(1), unless the authorization is terminated or revoked.     Resp Syncytial Virus by PCR NEGATIVE NEGATIVE Final    Comment: (NOTE) Fact Sheet for Patients: bloggercourse.com  Fact Sheet for Healthcare Providers: seriousbroker.it  This test is not yet approved or cleared by the [place]  FDA and has  been authorized for detection and/or diagnosis of SARS-CoV-2 by FDA under an Emergency Use Authorization (EUA). This EUA will remain in effect (meaning this test can be used) for the duration of the COVID-19 declaration under Section 564(b)(1) of the Act, 21 U.S.C. section 360bbb-3(b)(1), unless the authorization is terminated or revoked.  Performed at [facility], [address], [place], [place] [address]   Culture, blood (Routine x 2)     Status: None (Preliminary result)   Collection Time: 09/09/24 12:48 AM   Specimen: BLOOD RIGHT ARM  Result Value Ref Range Status   Specimen Description   Final    BLOOD RIGHT ARM Performed at [facility], [address] [place], [place] [address]    Special Requests   Final    BOTTLES DRAWN AEROBIC AND ANAEROBIC Blood Culture results may not be optimal due to an inadequate volume of blood received in culture bottles Performed at [facility], [address] [place], [place] [address]    Culture   Final    NO GROWTH 1 DAY Performed at [facility], [address], [place], [place] [address]    Report Status PENDING  Incomplete      Radiology Studies: CT ABDOMEN PELVIS W CONTRAST Result Date: 09/08/2024 CLINICAL DATA:  Abdominal pain. EXAM: CT ABDOMEN AND PELVIS WITH CONTRAST TECHNIQUE: Multidetector CT imaging of the abdomen and pelvis was performed using the standard protocol following bolus administration of intravenous contrast. RADIATION DOSE REDUCTION: This exam was performed according to  the departmental dose-optimization program which includes automated exposure control, adjustment of the mA and/or kV according to patient size and/or use of iterative reconstruction technique. CONTRAST:  80mL OMNIPAQUE IOHEXOL 300 MG/ML  SOLN COMPARISON:  None Available. FINDINGS: Lower chest: Bibasilar linear atelectasis/scarring. No intra-abdominal free air or free fluid. Hepatobiliary: The liver is unremarkable. No biliary dilatation. The gallbladder is unremarkable with Pancreas: Unremarkable. No pancreatic ductal dilatation or surrounding inflammatory changes. Spleen: Normal in size without focal abnormality. Adrenals/Urinary Tract: The adrenal glands unremarkable. The kidneys, visualized ureters, and urinary bladder appear unremarkable. Stomach/Bowel: There is moderate stool throughout the colon. There is no bowel obstruction or active inflammation. The appendix is normal. Vascular/Lymphatic: Mild aortoiliac atherosclerotic disease. The IVC is unremarkable. No portal venous gas. There is no adenopathy. Reproductive: Prostate brachytherapy seeds. Other: None Musculoskeletal: Osteopenia with degenerative changes. No acute osseous pathology. IMPRESSION: 1. No acute intra-abdominal or pelvic pathology. 2. Moderate colonic stool burden. No bowel obstruction. Normal appendix. 3.  Aortic Atherosclerosis (ICD10-I70.0). Electronically Signed   By: Vanetta Chou M.D.   On: 09/08/2024 19:26   CT Head Wo Contrast Result Date: 09/08/2024 EXAM: CT HEAD WITHOUT 09/08/2024 07:15:29 PM TECHNIQUE: CT of the head was performed without the administration of intravenous contrast. Automated exposure control, iterative reconstruction, and/or weight based adjustment of the mA/kV was utilized to reduce the radiation dose to as low as reasonably achievable. COMPARISON: None available. CLINICAL HISTORY: Head trauma, minor (Age >= 65y); Mental status change, unknown cause FINDINGS: BRAIN AND VENTRICLES: No acute intracranial  hemorrhage. No mass effect or midline shift. No extra-axial fluid collection. No evidence of acute infarct. Patchy and confluent decreased attenuation throughout the deep and periventricular white matter of the cerebral hemispheres bilaterally, compatible with chronic microvascular ischemic disease. Cerebral ventricle sizes concordant with degree of cerebral volume loss. Atherosclerotic calcifications within the cavernous Internal Carotid and Vertebral Arteries. No hydrocephalus. ORBITS: Bilateral lens replacement. SINUSES AND MASTOIDS: No acute abnormality.  SOFT TISSUES AND SKULL: No acute skull fracture. No acute soft tissue abnormality. IMPRESSION: 1. No acute intracranial abnormality. Electronically signed by: Morgane Naveau MD 09/08/2024 07:23 PM EST RP Workstation: [id number]   DG Chest Port 1 View Result Date: 09/08/2024 EXAM: 1 VIEW(S) XRAY OF THE CHEST 09/08/2024 05:52:00 PM COMPARISON: 02/10/2023 CLINICAL HISTORY: Questionable sepsis - evaluate for abnormality FINDINGS: LUNGS AND PLEURA: Minimal right mid lung opacity is noted concerning for subsegmental atelectasis or possibly infiltrate. No pleural effusion. No pneumothorax. HEART AND MEDIASTINUM: No acute abnormality of the cardiac and mediastinal silhouettes. BONES AND SOFT TISSUES: No acute osseous abnormality. IMPRESSION: 1. Minimal right mid lung opacity, likely subsegmental atelectasis versus infiltrate. Electronically signed by: Lynwood Seip MD 09/08/2024 06:00 PM EST RP Workstation: [id number]       LOS: 2 days   Joette Pebbles  [facility] Pager on [email or web address]  09/10/2024, 11:33 AM

## 2024-09-10 NOTE — Inpatient Diabetes Management (Signed)
 Inpatient Diabetes Program Recommendations  AACE/ADA: New Consensus Statement on Inpatient Glycemic Control (2015)  Target Ranges:  Prepandial:   less than 140 mg/dL      Peak postprandial:   less than 180 mg/dL (1-2 hours)      Critically ill patients:  140 - 180 mg/dL   Lab Results  Component Value Date   GLUCAP 219 (H) 09/10/2024   HGBA1C 7.6 (A) 06/01/2024   HGBA1C 7.6 06/01/2024   HGBA1C 7.6 (A) 06/01/2024   HGBA1C 7.6 (A) 06/01/2024    Review of Glycemic Control  Latest Reference Range & Units 09/09/24 07:25 09/09/24 11:26 09/09/24 16:25 09/09/24 22:24 09/10/24 07:51  Glucose-Capillary 70 - 99 mg/dL 742 (H) 787 (H) 872 (H) 216 (H) 219 (H)   Diabetes history: DM 2 Outpatient Diabetes medications: metformin  1000 mg BID, Januvia  100 mg daily  Current orders for Inpatient glycemic control:  Novolog  0-6 units tid  Inpatient Diabetes Program Recommendations:    -   Add Novolog  hs scale  Thanks,  Clotilda Bull RN, MSN, BC-ADM Inpatient Diabetes Coordinator Team Pager [phone] (8a-5p)

## 2024-09-10 NOTE — TOC Progression Note (Addendum)
 Transition of Care [facility]) - Progression Note    Patient Details  Name: Jeffery Stewart MRN: [id number] Date of Birth: 1936/01/02  [facility]) CM/SW Contact  Sonda Manuella Quill, RN Phone Number: 09/10/2024, 5:23 PM  Clinical Narrative:    PT recc SNF; spoke w/ pt's spouse Steven Basso at bedside; she agreed to recc; explained SNF process and ins auth needed; she verbalized understanding and said she does not have a facility preference; she would like bed search limited to [facility]; will initiate process.  -[id number]- received PASRR #[id number]; FL2 completed and sent for co-sign; faxed out via SNF hub; awaiting bed offers; ins auth needed.  Expected Discharge Plan: Home w Home Health Services Barriers to Discharge: Continued Medical Work up               Expected Discharge Plan and Services In-house Referral: NA Discharge Planning Services: CM Consult   Living arrangements for the past 2 months: Single Family Home                 DME Arranged: N/A DME Agency: NA       HH Arranged: NA HH Agency: NA         Social Drivers of Health (SDOH) Interventions SDOH Screenings   Food Insecurity: No Food Insecurity (09/08/2024)  Housing: Unknown (09/08/2024)  Transportation Needs: No Transportation Needs (09/08/2024)  Utilities: Not At Risk (09/08/2024)  Depression (PHQ2-9): High Risk (04/05/2024)  Social Connections: Unknown (09/08/2024)  Tobacco Use: Low Risk (09/08/2024)    Readmission Risk Interventions    09/09/2024    4:56 PM  Readmission Risk Prevention Plan  Transportation Screening Complete  PCP or Specialist Appt within 5-7 Days Complete  Home Care Screening Complete  Medication Review (RN CM) Complete

## 2024-09-10 NOTE — TOC Progression Note (Addendum)
 Transition of Care [facility]) - Progression Note    Patient Details  Name: Constance Hackenberg MRN: [id number] Date of Birth: 27-Aug-1936  [facility]) CM/SW Contact  Georgeanne Frankland, Glenys DASEN, RN  Clinical Narrative:    CM spoke with Avelina (wife). Agreeable to [facility] and wants [facility] does not have a preference to DME agency for hospital bed and 3in1. 3:49 PM [facility] for PT/OT/SLP/Aid  Expected Discharge Plan: Home w Home Health Services Barriers to Discharge: Continued Medical Work up               Expected Discharge Plan and Services In-house Referral: NA Discharge Planning Services: CM Consult   Living arrangements for the past 2 months: Single Family Home                 DME Arranged: N/A DME Agency: NA       HH Arranged: NA HH Agency: NA         Social Drivers of Health (SDOH) Interventions SDOH Screenings   Food Insecurity: No Food Insecurity (09/08/2024)  Housing: Unknown (09/08/2024)  Transportation Needs: No Transportation Needs (09/08/2024)  Utilities: Not At Risk (09/08/2024)  Depression (PHQ2-9): High Risk (04/05/2024)  Social Connections: Unknown (09/08/2024)  Tobacco Use: Low Risk (09/08/2024)    Readmission Risk Interventions    09/09/2024    4:56 PM  Readmission Risk Prevention Plan  Transportation Screening Complete  PCP or Specialist Appt within 5-7 Days Complete  Home Care Screening Complete  Medication Review (RN CM) Complete

## 2024-09-10 NOTE — [place] FL2 (Signed)
 [place]  MEDICAID FL2 LEVEL OF CARE FORM     IDENTIFICATION  Patient Name: Jeffery Stewart Birthdate: 09-Oct-1935 Sex: male Admission Date (Current Location): 09/08/2024  [facility] and [place] Number:  Producer, Television/film/video and Address:  [facility],  [address], [place] [address]      Provider Number: [id number]  Attending Physician Name and Address:  Verdene Purchase, MD  Relative Name and Phone Number:  Geoffery Aultman (spouse) [phone]    Current Level of Care: Hospital Recommended Level of Care: Skilled Nursing Facility Prior Approval Number:    Date Approved/Denied:   PASRR Number: [id number]  Discharge Plan: SNF    Current Diagnoses: Patient Active Problem List   Diagnosis Date Noted   Sepsis due to pneumonia (HCC) 09/08/2024   Closed fracture of head of humerus, left, initial encounter 09/08/2024   Urinary urgency 07/26/2024   Bilateral leg edema 06/28/2024   Vitamin D  insufficiency 06/28/2024   Fall 06/01/2024   Gait instability 04/29/2024   Nocturia 04/29/2024   Other insomnia 04/29/2024   Routine general medical examination at a health care facility 04/05/2024   Long term current use of oral hypoglycemic drug 03/03/2024   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   Chronic midline low back pain without sciatica 03/03/2024   Glaucoma of both eyes 03/03/2024   History of prostate cancer 03/03/2024   Angina pectoris 02/10/2023   Type 2 diabetes mellitus with diabetic neuropathy, unspecified (HCC) 02/10/2023   Dyslipidemia 02/10/2023   Dementia (HCC) 2012    Orientation RESPIRATION BLADDER Height & Weight     Self  Normal Indwelling catheter Weight: 67.5 kg Height:  5' 6 (167.6 cm)  BEHAVIORAL SYMPTOMS/MOOD NEUROLOGICAL BOWEL NUTRITION STATUS      Incontinent Diet (regular)  AMBULATORY STATUS COMMUNICATION OF NEEDS Skin   Extensive Assist Verbally PU Stage and Appropriate Care (Stage 2 to buttock and sacrum. MASD due to losing stools.   Pressure Injury Measurement: two open areas 1.5 cm x 1.5 cm  Wound bed: 100% red. Partial thickness.  Drainage Scant amount, serous, no odor.  Periwound: Erythema non bleachable.)   PU Stage 2 Dressing: Daily (change foam dressing q 3 days and prn soiling)                   Personal Care Assistance Level of Assistance  Bathing, Feeding, Dressing Bathing Assistance: Maximum assistance Feeding assistance: Limited assistance Dressing Assistance: Maximum assistance     Functional Limitations Info  Sight, Hearing, Speech Sight Info: Impaired Hearing Info: Impaired Speech Info: Adequate    SPECIAL CARE FACTORS FREQUENCY  PT (By licensed PT), OT (By licensed OT)     PT Frequency: 5x/week OT Frequency: 5x/week            Contractures Contractures Info: Not present    Additional Factors Info  Code Status, Allergies, Psychotropic, Insulin  Sliding Scale Code Status Info: DNR Allergies Info: Garlic Psychotropic Info: See MAR Insulin  Sliding Scale Info: See MAR       Current Medications (09/10/2024):  This is the current hospital active medication list Current Facility-Administered Medications  Medication Dose Route Frequency Provider Last Rate Last Admin   0.45 % sodium chloride  infusion   Intravenous Continuous Krishnan, Gokul, MD 50 mL/hr at 09/10/24 1300 New Bag at 09/10/24 1300   acetaminophen  (TYLENOL ) suppository 650 mg  650 mg Rectal Once Naasz, Hayley N, MD       acetaminophen  (TYLENOL ) tablet 650 mg  650 mg Oral Q6H PRN Ghimire, Kuber,  MD   650 mg at 09/10/24 1509   Or   acetaminophen  (TYLENOL ) suppository 650 mg  650 mg Rectal Q6H PRN Raenelle Coria, MD       azithromycin  (ZITHROMAX ) tablet 500 mg  500 mg Oral Daily Ghimire, Kuber, MD   500 mg at 09/10/24 0927   cefTRIAXone (ROCEPHIN) 2 g in sodium chloride  0.9 % 100 mL IVPB  2 g Intravenous Q24H Ghimire, Kuber, MD 200 mL/hr at 09/10/24 0502 2 g at 09/10/24 0502   Chlorhexidine Gluconate Cloth 2 % PADS 6 each  6  each Topical Daily Krishnan, Gokul, MD   6 each at 09/10/24 9070   cholecalciferol (VITAMIN D3) 25 MCG (1000 UNIT) tablet 2,000 Units  2,000 Units Oral Daily Franklyn Sid SAILOR, MD   2,000 Units at 09/10/24 9073   clonazePAM  (KLONOPIN ) tablet 1 mg  1 mg Oral QHS Ghimire, Kuber, MD   1 mg at 09/09/24 2210   cyanocobalamin  (VITAMIN B12) tablet 2,000 mcg  2,000 mcg Oral Daily Franklyn Sid SAILOR, MD   2,000 mcg at 09/10/24 9073   cycloSPORINE  (RESTASIS ) 0.05 % ophthalmic emulsion 1 drop  1 drop Both Eyes BID Ghimire, Kuber, MD   1 drop at 09/10/24 9072   docusate sodium  (COLACE) capsule 100 mg  100 mg Oral BID Ghimire, Kuber, MD   100 mg at 09/10/24 9072   DULoxetine  (CYMBALTA ) DR capsule 60 mg  60 mg Oral Daily Ghimire, Kuber, MD   60 mg at 09/10/24 9073   enoxaparin  (LOVENOX ) injection 40 mg  40 mg Subcutaneous Q24H Ghimire, Kuber, MD   40 mg at 09/09/24 2212   famotidine  (PEPCID ) tablet 20 mg  20 mg Oral BID Ghimire, Kuber, MD   20 mg at 09/10/24 9072   gabapentin  (NEURONTIN ) capsule 300 mg  300 mg Oral TID Ghimire, Kuber, MD   300 mg at 09/10/24 1605   hydrOXYzine (ATARAX) tablet 25 mg  25 mg Oral Daily PRN Ghimire, Kuber, MD   25 mg at 09/10/24 1605   insulin  aspart (novoLOG ) injection 0-6 Units  0-6 Units Subcutaneous TID WC Ghimire, Kuber, MD   3 Units at 09/10/24 1701   lidocaine (LIDODERM) 5 % 1 patch  1 patch Transdermal Daily PRN Ghimire, Kuber, MD   1 patch at 09/10/24 9072   morphine  (PF) 2 MG/ML injection 2 mg  2 mg Intravenous Q2H PRN Ghimire, Kuber, MD       nitroGLYCERIN  (NITROSTAT ) SL tablet 0.4 mg  0.4 mg Sublingual Q5 Min x 3 PRN Ghimire, Kuber, MD       ondansetron  (ZOFRAN ) tablet 4 mg  4 mg Oral Q6H PRN Ghimire, Kuber, MD       Or   ondansetron  (ZOFRAN ) injection 4 mg  4 mg Intravenous Q6H PRN Raenelle Coria, MD       oxyCODONE  (Oxy IR/ROXICODONE ) immediate release tablet 5 mg  5 mg Oral Q4H PRN Ghimire, Kuber, MD   5 mg at 09/10/24 1509   phenazopyridine (PYRIDIUM) tablet 100 mg  100  mg Oral TID WC Krishnan, Gokul, MD   100 mg at 09/10/24 1605   polyethylene glycol (MIRALAX  / GLYCOLAX ) packet 17 g  17 g Oral Daily Raenelle Coria, MD   17 g at 09/08/24 2245   tamsulosin  (FLOMAX ) capsule 0.4 mg  0.4 mg Oral Daily Krishnan, Gokul, MD   0.4 mg at 09/10/24 1259   zolpidem  (AMBIEN ) tablet 5 mg  5 mg Oral QHS Naasz, Hayley N, MD   5 mg  at 09/09/24 2220     Discharge Medications: Please see discharge summary for a list of discharge medications.  Relevant Imaging Results:  Relevant Lab Results:   Additional Information SSN [id number]  Sonda Manuella Quill, RN
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
## 2024-09-10 NOTE — Evaluation (Signed)
 Physical Therapy Evaluation Patient Details Name: Jeffery Stewart MRN: [id number] DOB: 02-21-1936 Today's Date: 09/10/2024  History of Present Illness  [name] is a 88 y.o. male who lives at home with his wife.  He had a mechanical fall 2 days ago and suffered left humerus fracture.  Was seen in the emergency room and sent home with sling.  Patient went to see orthopedics yesterday, was advised conservative management. PMH: advanced dementia, type 2 diabetes, neuropathy, postpolio syndrome  Clinical Impression  Prior to admission, pt was living home with support from spouse and family. He was able to ambulate around his home IND via furniture surfing. Recent fall resulted in L humerus fracture and conservative method of healing recommended from Ortho follow up; no formal weight bearing orders placed, however PT/OT treated LUE as NWB during evaluation to minimize pain during mobility. Patient currently requires +2 MOD-MAX for bed mobility, transfers and short distance ambulation. See problem list below for current deficits. Additionally, his family is unable to provide the current support he requires. PT recommends continued skilled physical therapy follow up at discharge with SNF for <3hrs/day. PT will continue to follow while he is in acute hospital.         If plan is discharge home, recommend the following: A little help with walking and/or transfers;A little help with bathing/dressing/bathroom;Assistance with cooking/housework;Assist for transportation;Help with stairs or ramp for entrance   Can travel by private vehicle   No    Equipment Recommendations Other (comment) (defer to next level of care)  Recommendations for Other Services       Functional Status Assessment Patient has had a recent decline in their functional status and demonstrates the ability to make significant improvements in function in a reasonable and predictable amount of time.     Precautions / Restrictions  Precautions Precautions: Fall;Other (comment) (L humerus fx, no formal precautions; treating at NWB) Recall of Precautions/Restrictions: Impaired Restrictions Weight Bearing Restrictions Per Provider Order: No Other Position/Activity Restrictions: no formal weight bearing precautions noted for L humerus fracture, being treated conservatively; PT/OT at eval treated as NWB for safety and pain management      Mobility  Bed Mobility Overal bed mobility: Needs Assistance Bed Mobility: Supine to Sit     Supine to sit: HOB elevated, Used rails, +2 for safety/equipment, +2 for physical assistance, Mod assist, Max assist     General bed mobility comments: heavy use of bed features, scooting assist to allow feet to rest on floor, OT with continual support to LUE to minimize pain with mobility    Transfers Overall transfer level: Needs assistance Equipment used: 2 person hand held assist Transfers: Sit to/from Stand, Bed to chair/wheelchair/BSC Sit to Stand: Mod assist, +2 physical assistance, +2 safety/equipment Stand pivot transfers: +2 physical assistance, +2 safety/equipment, Mod assist         General transfer comment: continual 1-2 step cues with frequent redirection during step to pivot to recliner as patient easily distracted with IV pole, family, and moving towards door; Assistance for power to stand, controlled lowering to seat, management of all medical equipment and support of LUE due to pain    Ambulation/Gait Ambulation/Gait assistance: +2 physical assistance, +2 safety/equipment, Mod assist Gait Distance (Feet): 3 Feet (steps to recliner) Assistive device: 2 person hand held assist Gait Pattern/deviations: Narrow base of support, Shuffle, Decreased step length - left, Decreased step length - right Gait velocity: decr     General Gait Details: cues for attention to task as  patient easily distracted while walking to recliner, hand hold support on R to keep patient from  reaching for IV pole and for balance support, second person supporting L arm in sling and additional balance support  Stairs            Wheelchair Mobility     Tilt Bed    Modified Rankin (Stroke Patients Only)       Balance Overall balance assessment: Needs assistance Sitting-balance support: Single extremity supported, Feet supported Sitting balance-Leahy Scale: Fair   Postural control: Posterior lean Standing balance support: Single extremity supported, During functional activity Standing balance-Leahy Scale: Poor                               Pertinent Vitals/Pain Pain Assessment Pain Assessment: No/denies pain    Home Living Family/patient expects to be discharged to:: Private residence Living Arrangements: Spouse/significant other Available Help at Discharge: Family Type of Home: House             Additional Comments: per family present at eval, pt was previously IND with mobility via furniture surfing    Prior Function Prior Level of Function : Needs assist  Cognitive Assist : Mobility (cognitive);ADLs (cognitive) Mobility (Cognitive): Step by step cues   Physical Assist : ADLs (physical) Mobility (physical): Transfers;Gait ADLs (physical): Dressing;IADLs Mobility Comments: IND via furniture surfing per family report ADLs Comments: able to help with dressing and bathing, requires assistance in/out of bathtub for safety     Extremity/Trunk Assessment   Upper Extremity Assessment Upper Extremity Assessment: Defer to OT evaluation;LUE deficits/detail LUE Deficits / Details: L humerus fx, shoulder sling    Lower Extremity Assessment Lower Extremity Assessment: LLE deficits/detail;Generalized weakness LLE Deficits / Details: decreased strength grossly 3-/5 at hip flexion, 4-/5 at knee flex/ex    Cervical / Trunk Assessment Cervical / Trunk Assessment: Normal  Communication   Communication Communication: Impaired Factors  Affecting Communication: Hearing impaired    Cognition Arousal: Alert Behavior During Therapy: WFL for tasks assessed/performed   PT - Cognitive impairments: History of cognitive impairments                       PT - Cognition Comments: dementia at baseline, family assists with subjective intake Following commands: Impaired Following commands impaired: Only follows one step commands consistently, Follows one step commands with increased time, Follows multi-step commands inconsistently     Cueing Cueing Techniques: Verbal cues, Gestural cues, Tactile cues     General Comments General comments (skin integrity, edema, etc.): L UE in sling immobilized with bruising and mild edema noted, tender and caution used for sling adjustement, no SOB noted with session    Exercises     Assessment/Plan    PT Assessment Patient needs continued PT services  PT Problem List Decreased strength;Other (comment);Decreased activity tolerance;Decreased balance;Decreased mobility;Decreased safety awareness;Pain (baseline cognitive deficits impair carryover of education and training of safe mobility)       PT Treatment Interventions Patient/family education;Functional mobility training;Therapeutic activities;Therapeutic exercise;Balance training;Gait training    PT Goals (Current goals can be found in the Care Plan section)  Acute Rehab PT Goals Patient Stated Goal: get back home, move with minimal pain PT Goal Formulation: With family Time For Goal Achievement: 09/24/24 Potential to Achieve Goals: Good    Frequency Min 2X/week     Co-evaluation PT/OT/SLP Co-Evaluation/Treatment: Yes Reason for Co-Treatment: Complexity of the patient's impairments (multi-system involvement);For  patient/therapist safety PT goals addressed during session: Mobility/safety with mobility;Balance;Proper use of DME OT goals addressed during session: ADL's and self-care;Proper use of Adaptive equipment and DME        AM-PAC PT 6 Clicks Mobility  Outcome Measure Help needed turning from your back to your side while in a flat bed without using bedrails?: A Lot Help needed moving from lying on your back to sitting on the side of a flat bed without using bedrails?: A Lot Help needed moving to and from a bed to a chair (including a wheelchair)?: A Lot Help needed standing up from a chair using your arms (e.g., wheelchair or bedside chair)?: A Lot Help needed to walk in hospital room?: A Lot Help needed climbing 3-5 steps with a railing? : A Lot 6 Click Score: 12    End of Session Equipment Utilized During Treatment: Gait belt Activity Tolerance: Patient tolerated treatment well Patient left: in chair;with call bell/phone within reach;with chair alarm set;with family/visitor present Nurse Communication: Mobility status PT Visit Diagnosis: Unsteadiness on feet (R26.81);History of falling (Z91.81);Difficulty in walking, not elsewhere classified (R26.2)    Time: 1335-1410 PT Time Calculation (min) (ACUTE ONLY): 35 min   Charges:   PT Evaluation $PT Eval Moderate Complexity: 1 Mod PT Treatments $Therapeutic Activity: 8-22 mins           Isaiah DEL. Arjuna Doeden, PT, DPT   [employer] 09/10/2024, 3:01 PM

## 2024-09-10 NOTE — Plan of Care (Signed)
°  Problem: Metabolic: Goal: Ability to maintain appropriate glucose levels will improve Outcome: Progressing   Problem: Education: Goal: Ability to describe self-care measures that may prevent or decrease complications (Diabetes Survival Skills Education) will improve Outcome: Not Progressing Goal: Individualized Educational Video(s) Outcome: Not Progressing   Problem: Coping: Goal: Ability to adjust to condition or change in health will improve Outcome: Not Progressing   Problem: Fluid Volume: Goal: Ability to maintain a balanced intake and output will improve Outcome: Not Progressing   Problem: Health Behavior/Discharge Planning: Goal: Ability to identify and utilize available resources and services will improve Outcome: Not Progressing Goal: Ability to manage health-related needs will improve Outcome: Not Progressing

## 2024-09-10 NOTE — Evaluation (Signed)
 Occupational Therapy Evaluation Patient Details Name: Jeffery Stewart MRN: [id number] DOB: 08-Nov-1935 Today's Date: 09/10/2024   History of Present Illness   Jeffery Stewart is a 88 y.o. male who lives at home with his wife.  He had a mechanical fall 2 days ago and suffered left humerus fracture.  Was seen in the emergency room and sent home with sling.  Patient went to see orthopedics yesterday, was advised conservative management. PMH: advanced dementia, type 2 diabetes, neuropathy, postpolio syndrome     Clinical Impressions PTA, patient lives at home with family and has been requiring increased assist since fall with L humeral fx from baseline supervision due to dementia. Excellent support of wife and dtr at bedside to assist with history and course of health info/plof and patient cooperative and pleasantly confused. Currently, patient presents with sub-baseline deficits outlined below (see OT Problem List for details) most significantly pain, decreased L UE use due to immobilization from fx now in sling, balance, activity tolerance and cognitive deficits limiting BADL (max A) and functional mobility (+2 mod A transfer) performance. Patient will benefit from continued inpatient follow up therapy, <3 hours/day. Patient requires continued Acute care hospital level OT services to progress safety and functional performance and allow for discharge.     If plan is discharge home, recommend the following:   Two people to help with walking and/or transfers;A lot of help with bathing/dressing/bathroom;Assistance with cooking/housework;Assistance with feeding;Direct supervision/assist for medications management;Direct supervision/assist for financial management;Assist for transportation;Help with stairs or ramp for entrance;Supervision due to cognitive status     Functional Status Assessment   Patient has had a recent decline in their functional status and demonstrates the ability to make significant  improvements in function in a reasonable and predictable amount of time.     Equipment Recommendations   Other (comment) (TBD at rehab)      Precautions/Restrictions   Precautions Precautions: Fall;Other (comment) (L shoulder) Recall of Precautions/Restrictions: Impaired Precaution/Restrictions Comments: NWB L shoulder in sling Required Braces or Orthoses: Sling Restrictions Weight Bearing Restrictions Per Provider Order: Yes LUE Weight Bearing Per Provider Order: Non weight bearing Other Position/Activity Restrictions: saw ortho with chart notes to remain in sling and family reports ortho f/u in 2 weeks     Mobility Bed Mobility Overal bed mobility: Needs Assistance Bed Mobility: Sidelying to Sit, Supine to Sit   Sidelying to sit: +2 for physical assistance, +2 for safety/equipment, HOB elevated, Used rails, Mod assist, Max assist Supine to sit: Mod assist, Max assist, HOB elevated, Used rails, +2 for physical assistance, +2 for safety/equipment     General bed mobility comments: increased time, use of bed pad to assist with hips and trunk and protect L UE using bed features with max cues    Transfers Overall transfer level: Needs assistance Equipment used: 2 person hand held assist Transfers: Sit to/from Stand, Bed to chair/wheelchair/BSC Sit to Stand: Mod assist, +2 physical assistance, +2 safety/equipment, From elevated surface     Step pivot transfers: Mod assist, +2 physical assistance, +2 safety/equipment, From elevated surface     General transfer comment: max cues for hand placement, steps of transfers, directionality and to avoid line figeting      Balance Overall balance assessment: Needs assistance Sitting-balance support: Single extremity supported, Feet supported Sitting balance-Leahy Scale: Fair   Postural control:  (not challenged) Standing balance support: Single extremity supported Standing balance-Leahy Scale: Poor Standing balance comment:  + 2 assist with use of gait belt and hand helt assist  for steadying                           ADL either performed or assessed with clinical judgement   ADL Overall ADL's : Needs assistance/impaired Eating/Feeding: Moderate assistance;Bed level;Cueing for sequencing;Cueing for safety Eating/Feeding Details (indicate cue type and reason): on Dys 1 diet, one hand use on R side only Grooming: Wash/dry hands;Wash/dry face;Oral care;Moderate assistance;Sitting;Cueing for compensatory techniques;Cueing for sequencing;Adhering to UE precautions;Cueing for safety   Upper Body Bathing: Maximal assistance;Sitting   Lower Body Bathing: Total assistance;Sit to/from stand   Upper Body Dressing : Sitting;Maximal assistance;Cueing for UE precautions;Cueing for sequencing;Cueing for safety;Cueing for compensatory techniques Upper Body Dressing Details (indicate cue type and reason): including sling mngt Lower Body Dressing: Total assistance   Toilet Transfer: +2 for physical assistance;+2 for safety/equipment   Toileting- Clothing Manipulation and Hygiene: Total assistance;Bed level Toileting - Clothing Manipulation Details (indicate cue type and reason): has Foley catheter currently     Functional mobility during ADLs: Moderate assistance;+2 for physical assistance;+2 for safety/equipment;Cueing for safety;Cueing for sequencing General ADL Comments: cognition and L UE immobilized limits functional ADL's     Vision Baseline Vision/History: 1 Wears glasses;0 No visual deficits Ability to See in Adequate Light: 0 Adequate Patient Visual Report: No change from baseline Vision Assessment?: No apparent visual deficits     Perception Perception: Not tested       Praxis Praxis: Impaired Praxis Impairment Details: Initiation, Ideomotor, Perseveration, Motor planning, Organization     Pertinent Vitals/Pain Pain Assessment Pain Assessment: Faces Faces Pain Scale: Hurts even  more Breathing: normal Negative Vocalization: occasional moan/groan, low speech, negative/disapproving quality Facial Expression: sad, frightened, frown Body Language: tense, distressed pacing, fidgeting Consolability: distracted or reassured by voice/touch PAINAD Score: 4 Pain Location: L shoulder Pain Descriptors / Indicators: Discomfort, Grimacing, Guarding Pain Intervention(s): Limited activity within patient's tolerance, Monitored during session, Premedicated before session, Repositioned, Relaxation, Other (comment) (distraction and pillow support with sling readjustment)     Extremity/Trunk Assessment Upper Extremity Assessment Upper Extremity Assessment: Defer to OT evaluation;LUE deficits/detail LUE Deficits / Details: L humerus fx, shoulder sling   Lower Extremity Assessment Lower Extremity Assessment: LLE deficits/detail;Generalized weakness LLE Deficits / Details: decreased strength grossly 3-/5 at hip flexion, 4-/5 at knee flex/ex   Cervical / Trunk Assessment Cervical / Trunk Assessment: Normal   Communication Communication Communication: Impaired Factors Affecting Communication: Hearing impaired   Cognition Arousal: Alert Behavior During Therapy: Impulsive (fidgets with lines) Cognition: History of cognitive impairments             OT - Cognition Comments: awake, alert, Ox 1, cues for sustained attention, redirection from fidgiting with lines and IV, poor safety, judgement, limited processing                 Following commands: Impaired Following commands impaired: Follows one step commands inconsistently, Follows one step commands with increased time     Cueing  General Comments   Cueing Techniques: Verbal cues;Gestural cues;Tactile cues;Visual cues  L UE in sling immobilized with bruising and mild edema noted, tender and caution used for sling adjustement, no SOB noted with session   Exercises Exercises: Other exercises (issued R UE sqeeze ball  and educated family to allow to use as distraction from pulling on lines)        Home Living Family/patient expects to be discharged to:: Private residence Living Arrangements: Spouse/significant other Available Help at Discharge: Family Type of Home:  House             Bathroom Shower/Tub: Chief Strategy Officer: Standard         Additional Comments: per family present at eval, pt was previously IND with mobility via furniture surfing      Prior Functioning/Environment Prior Level of Function : Needs assist  Cognitive Assist : Mobility (cognitive);ADLs (cognitive) Mobility (Cognitive): Step by step cues   Physical Assist : ADLs (physical) Mobility (physical): Transfers;Gait ADLs (physical): Dressing;IADLs Mobility Comments: IND via furniture surfing per family report ADLs Comments: able to help with dressing and bathing, requires assistance in/out of bathtub for safety    OT Problem List: Decreased range of motion;Decreased activity tolerance;Impaired balance (sitting and/or standing);Decreased coordination;Decreased cognition;Decreased safety awareness;Decreased knowledge of use of DME or AE;Decreased knowledge of precautions;Impaired UE functional use;Pain   OT Treatment/Interventions: Self-care/ADL training;Therapeutic exercise;Neuromuscular education;Energy conservation;DME and/or AE instruction;Therapeutic activities;Cognitive remediation/compensation;Patient/family education;Balance training      OT Goals(Current goals can be found in the care plan section)   Acute Rehab OT Goals Patient Stated Goal: to go to get therapy and get stronger OT Goal Formulation: With patient/family Time For Goal Achievement: 09/24/24 Potential to Achieve Goals: Fair ADL Goals Pt Will Perform Eating: with supervision;sitting Pt Will Perform Grooming: with min assist;sitting Pt Will Perform Upper Body Bathing: with mod assist;sitting Pt Will Perform Upper Body  Dressing: with max assist;sitting;with caregiver independent in assisting Pt Will Transfer to Toilet: with min assist;with +2 assist;stand pivot transfer;bedside commode   OT Frequency:  Min 2X/week    Co-evaluation   Reason for Co-Treatment: Complexity of the patient's impairments (multi-system involvement);For patient/therapist safety PT goals addressed during session: Mobility/safety with mobility;Balance;Proper use of DME OT goals addressed during session: ADL's and self-care;Proper use of Adaptive equipment and DME      AM-PAC OT 6 Clicks Daily Activity     Outcome Measure Help from another person eating meals?: A Little Help from another person taking care of personal grooming?: A Little Help from another person toileting, which includes using toliet, bedpan, or urinal?: Total Help from another person bathing (including washing, rinsing, drying)?: A Lot Help from another person to put on and taking off regular upper body clothing?: A Lot Help from another person to put on and taking off regular lower body clothing?: Total 6 Click Score: 12   End of Session Equipment Utilized During Treatment: Gait belt;Other (comment) (sling) Nurse Communication: Mobility status;Weight bearing status;Precautions;Other (comment) (need for +2 assist)  Activity Tolerance: Patient tolerated treatment well Patient left: in chair;with call bell/phone within reach;with chair alarm set;with family/visitor present  OT Visit Diagnosis: Unsteadiness on feet (R26.81);Other abnormalities of gait and mobility (R26.89);History of falling (Z91.81);Feeding difficulties (R63.3);Cognitive communication deficit (R41.841);Pain Pain - Right/Left: Left Pain - part of body: Shoulder                Time: 1335-1410 OT Time Calculation (min): 35 min Charges:  OT General Charges $OT Visit: 1 Visit OT Evaluation $OT Eval Moderate Complexity: 1 Mod  Meliton Samad OT/L Acute Rehabilitation Department  [phone]  09/10/2024, 2:59 PM

## 2024-09-11 DIAGNOSIS — G301 Alzheimer's disease with late onset: Secondary | ICD-10-CM | POA: Diagnosis not present

## 2024-09-11 DIAGNOSIS — F02C18 Dementia in other diseases classified elsewhere, severe, with other behavioral disturbance: Secondary | ICD-10-CM | POA: Diagnosis not present

## 2024-09-11 DIAGNOSIS — J189 Pneumonia, unspecified organism: Secondary | ICD-10-CM | POA: Diagnosis not present

## 2024-09-11 LAB — CBC
HCT: 31.8 % — ABNORMAL LOW (ref 39.0–52.0)
Hemoglobin: 10.5 g/dL — ABNORMAL LOW (ref 13.0–17.0)
MCH: 30.6 pg (ref 26.0–34.0)
MCHC: 33 g/dL (ref 30.0–36.0)
MCV: 92.7 fL (ref 80.0–100.0)
Platelets: 341 K/uL (ref 150–400)
RBC: 3.43 MIL/uL — ABNORMAL LOW (ref 4.22–5.81)
RDW: 13.6 % (ref 11.5–15.5)
WBC: 7.6 K/uL (ref 4.0–10.5)
nRBC: 0 % (ref 0.0–0.2)

## 2024-09-11 LAB — BASIC METABOLIC PANEL WITH GFR
Anion gap: 13 (ref 5–15)
BUN: 17 mg/dL (ref 8–23)
CO2: 20 mmol/L — ABNORMAL LOW (ref 22–32)
Calcium: 8.6 mg/dL — ABNORMAL LOW (ref 8.9–10.3)
Chloride: 101 mmol/L (ref 98–111)
Creatinine, Ser: 1.18 mg/dL (ref 0.61–1.24)
GFR, Estimated: >60 mL/min (ref >60)
Glucose, Bld: 213 mg/dL — ABNORMAL HIGH (ref 70–99)
Potassium: 4.6 mmol/L (ref 3.5–5.1)
Sodium: 134 mmol/L — ABNORMAL LOW (ref 135–145)

## 2024-09-11 LAB — GLUCOSE, CAPILLARY
Glucose-Capillary: 261 mg/dL — ABNORMAL HIGH (ref 70–99)
Glucose-Capillary: 226 mg/dL — ABNORMAL HIGH (ref 70–99)
Glucose-Capillary: 247 mg/dL — ABNORMAL HIGH (ref 70–99)
Glucose-Capillary: 263 mg/dL — ABNORMAL HIGH (ref 70–99)

## 2024-09-11 LAB — LEGIONELLA PNEUMOPHILA SEROGP 1 UR AG: L. pneumophila Serogp 1 Ur Ag: NEGATIVE

## 2024-09-11 MED ORDER — LINAGLIPTIN 5 MG PO TABS
5.0000 mg | ORAL_TABLET | Freq: Every day | ORAL | Status: DC
  Administered 2024-09-11 – 2024-09-16 (×6): 5 mg via ORAL
  Filled 2024-09-11 (×6): qty 1

## 2024-09-11 MED ORDER — AMOXICILLIN-POT CLAVULANATE 875-125 MG PO TABS
1.0000 | ORAL_TABLET | Freq: Two times a day (BID) | ORAL | Status: DC
  Administered 2024-09-11 – 2024-09-14 (×6): 1 via ORAL
  Filled 2024-09-11 (×7): qty 1

## 2024-09-11 MED ORDER — METFORMIN HCL 500 MG PO TABS
1000.0000 mg | ORAL_TABLET | Freq: Two times a day (BID) | ORAL | Status: DC
  Administered 2024-09-11 – 2024-09-16 (×11): 1000 mg via ORAL
  Filled 2024-09-11 (×11): qty 2

## 2024-09-11 NOTE — Plan of Care (Signed)

## 2024-09-11 NOTE — Progress Notes (Signed)
 [facility] PROGRESS NOTE   Jeffery Stewart FMW:[id number] DOB: [birth year] DOA: 09/08/2024  PCP: Nedra Tinnie LABOR, NP  Brief History: 88 y.o. male with medical history significant of advanced dementia, type 2 diabetes, neuropathy, postpolio syndrome who lives at home with his wife.  He had a mechanical fall 2 days prior to admission and suffered left humerus fracture.  Was seen in the emergency room and sent home with sling.  Patient went to see orthopedics today for admission, was advised conservative management.  Patient does have restlessness, anxiety at baseline.  Patient was brought in due to increased lethargy and low-grade fever.  Patient was suspected of having pneumonia.  He was hospitalized for further management.    Consultants: None  Procedures: None  Subjective/Interval History: Patient noted to be awake alert.  His wife and daughter are at the bedside.  Apparently he did get some sleep overnight.  No complaints of for this morning.     Assessment/Plan:  Pneumonia, possibly aspiration/sepsis, present on admission Patient was started on ceftriaxone  and azithromycin . Cultures are negative so far.  Concern for aspiration.  SLP evaluation. UA was noted to be unremarkable.  COVID-19, influenza and RSV PCR negative.  WBC was noted to be elevated.  Procalcitonin 0.19.  Lactic acid was elevated at 3.3 and then improved to 2.9.  CT of the abdomen pelvis did not show any acute findings.  Constipation was suggested.  WBC is now normal. Respiratory status is stable. Will transition to Augmentin  today.  History of advanced dementia with behavioral disturbance/acute metabolic encephalopathy Worsening mentation possibly due to infection. CT head did not show any acute findings. Mentation has improved with treatment of the pneumonia.  Acute urinary retention Most likely due to acute illness and generalized weakness.  Foley catheter had to be inserted.  Continue with Flomax .   Mobilize and attempt voiding trial in the next 24 to 48 hours.   Diabetes mellitus type 2 Monitor CBGs.  SSI.  Glucose levels noted to be elevated. Prior to admission patient noted to be on metformin  and Januvia .  These can be resumed.  Chronic kidney disease stage IIIb Renal function close to baseline.  Continue to monitor.  Avoid nephrotoxic agents  Normocytic anemia Drop in hemoglobin is dilutional.  No evidence of overt bleeding.  Left humeral fracture Seen by orthopedics recently.  Conservative management.  Continue with sling.   DVT Prophylaxis: Lovenox  Code Status: DNR Family Communication: Discussed with patient's wife and daughter Disposition Plan: SNF as recommended by PT/OT.  Patient family is considering placement to SNF for short-term rehab.  TOC is following.  According to family, at baseline patient is able to ambulate without assistance.    Medications: Scheduled:  acetaminophen   650 mg Rectal Once   azithromycin   500 mg Oral Daily   Chlorhexidine  Gluconate Cloth  6 each Topical Daily   cholecalciferol   2,000 Units Oral Daily   clonazePAM   1 mg Oral QHS   vitamin B-12  2,000 mcg Oral Daily   cycloSPORINE   1 drop Both Eyes BID   docusate sodium   100 mg Oral BID   DULoxetine   60 mg Oral Daily   enoxaparin  (LOVENOX ) injection  40 mg Subcutaneous Q24H   famotidine   20 mg Oral BID   gabapentin   300 mg Oral TID   insulin  aspart  0-6 Units Subcutaneous TID WC   polyethylene glycol  17 g Oral Daily   tamsulosin   0.4 mg Oral Daily   zolpidem   5 mg  Oral QHS   Continuous:  sodium chloride  50 mL/hr at 09/10/24 1945   cefTRIAXone  (ROCEPHIN )  IV 2 g (09/11/24 0528)   PRN:acetaminophen  **OR** acetaminophen , hydrOXYzine , lidocaine , morphine  injection, nitroGLYCERIN , ondansetron  **OR** ondansetron  (ZOFRAN ) IV, oxyCODONE   Antibiotics: Anti-infectives (From admission, onward)    Start     Dose/Rate Route Frequency Ordered Stop   09/09/24 1800  azithromycin  (ZITHROMAX )  tablet 500 mg        500 mg Oral Daily 09/08/24 2044 09/14/24 0959   09/09/24 0600  cefTRIAXone  (ROCEPHIN ) 2 g in sodium chloride  0.9 % 100 mL IVPB        2 g 200 mL/hr over 30 Minutes Intravenous Every 24 hours 09/08/24 2044 09/14/24 0559   09/08/24 1900  azithromycin  (ZITHROMAX ) 500 mg in sodium chloride  0.9 % 250 mL IVPB        500 mg 250 mL/hr over 60 Minutes Intravenous  Once 09/08/24 1849 09/08/24 2102   09/08/24 1700  ceFEPIme  (MAXIPIME ) 2 g in sodium chloride  0.9 % 100 mL IVPB        2 g 200 mL/hr over 30 Minutes Intravenous  Once 09/08/24 1655 09/08/24 1801       Objective:  Vital Signs  Vitals:   09/10/24 0438 09/10/24 1322 09/10/24 2041 09/11/24 0515  BP: 133/67 (!) 115/52 117/60 (!) 144/72  Pulse: 74 61 74 73  Resp: 18 20 18 18   Temp: 98.5 F (36.9 C) 98.2 F (36.8 C) 98.4 F (36.9 C) 98.3 F (36.8 C)  TempSrc:   Oral Oral  SpO2: 100% 99% 93% 96%  Weight:      Height:        Intake/Output Summary (Last 24 hours) at 09/11/2024 1058 Last data filed at 09/11/2024 0900 Gross per 24 hour  Intake 1602.75 ml  Output 2000 ml  Net -397.25 ml   Filed Weights   09/08/24 1621  Weight: 67.5 kg    General appearance: Awake alert.  In no distress Resp: Clear to auscultation bilaterally.  Normal effort Cardio: S1-S2 is normal regular.  No S3-S4.  No rubs murmurs or bruit GI: Abdomen is soft.  Nontender nondistended.  Bowel sounds are present normal.  No masses organomegaly Extremities: No edema.  Full range of motion of lower extremities. No focal neurological deficits.    Lab Results:  Data Reviewed: I have personally reviewed following labs and reports of the imaging studies  CBC: Recent Labs  Lab 09/08/24 1638 09/09/24 0048 09/10/24 0917 09/11/24 0658  WBC 14.9* 16.1* 9.4 7.6  NEUTROABS 13.0*  --   --   --   HGB 11.4* 11.7* 10.9* 10.5*  HCT 35.6* 35.0* 33.1* 31.8*  MCV 92.2 90.9 92.5 92.7  PLT 301 349 310 341    Basic Metabolic Panel: Recent  Labs  Lab 09/08/24 1638 09/09/24 0048 09/10/24 0550 09/11/24 0658  NA 135 136 137 134*  K 5.3* 4.8 4.7 4.6  CL 99 102 102 101  CO2 24 20* 27 20*  GLUCOSE 292* 253* 191* 213*  BUN 31* 27* 24* 17  CREATININE 1.44* 1.31* 1.32* 1.18  CALCIUM  9.1 9.1 8.7* 8.6*  MG  --   --  2.3  --     GFR: Estimated Creatinine Clearance: 40.6 mL/min (by C-G formula based on SCr of 1.18 mg/dL).  Liver Function Tests: Recent Labs  Lab 09/08/24 1638  AST 14*  ALT 6  ALKPHOS 55  BILITOT 0.3  PROT 6.4*  ALBUMIN 3.7    Coagulation Profile: Recent  Labs  Lab 09/08/24 1638  INR 1.0    BNP (last 3 results) Recent Labs    06/28/24 1619  PROBNP 44.0    CBG: Recent Labs  Lab 09/10/24 0751 09/10/24 1208 09/10/24 1642 09/10/24 2118 09/11/24 0753  GLUCAP 219* 193* 298* 190* 261*     Recent Results (from the past 240 hours)  Culture, blood (Routine x 2)     Status: None (Preliminary result)   Collection Time: 09/08/24  4:29 PM   Specimen: BLOOD  Result Value Ref Range Status   Specimen Description   Final    BLOOD RIGHT ANTECUBITAL Performed at [facility], [address], [place], [place] [address]    Special Requests   Final    BOTTLES DRAWN AEROBIC AND ANAEROBIC Blood Culture results may not be optimal due to an inadequate volume of blood received in culture bottles Performed at [facility], [address], [place], [place] [address]    Culture   Final    NO GROWTH 3 DAYS Performed at [facility], [address], [place], [place] [address]    Report Status PENDING  Incomplete  Resp panel by RT-PCR (RSV, Flu A&B, Covid) Anterior Nasal Swab     Status: None   Collection Time: 09/08/24  6:26 PM   Specimen: Anterior Nasal Swab  Result Value Ref Range Status   SARS Coronavirus 2 by RT PCR NEGATIVE NEGATIVE Final    Comment: (NOTE) SARS-CoV-2 target nucleic acids are NOT DETECTED.  The SARS-CoV-2 RNA is generally detectable in  upper respiratory specimens during the acute phase of infection. The lowest concentration of SARS-CoV-2 viral copies this assay can detect is 138 copies/mL. A negative result does not preclude SARS-Cov-2 infection and should not be used as the sole basis for treatment or other patient management decisions. A negative result may occur with  improper specimen collection/handling, submission of specimen other than nasopharyngeal swab, presence of viral mutation(s) within the areas targeted by this assay, and inadequate number of viral copies(<138 copies/mL). A negative result must be combined with clinical observations, patient history, and epidemiological information. The expected result is Negative.  Fact Sheet for Patients:  bloggercourse.com  Fact Sheet for Healthcare Providers:  seriousbroker.it  This test is no t yet approved or cleared by the [place]  FDA and  has been authorized for detection and/or diagnosis of SARS-CoV-2 by FDA under an Emergency Use Authorization (EUA). This EUA will remain  in effect (meaning this test can be used) for the duration of the COVID-19 declaration under Section 564(b)(1) of the Act, 21 U.S.C.section 360bbb-3(b)(1), unless the authorization is terminated  or revoked sooner.       Influenza A by PCR NEGATIVE NEGATIVE Final   Influenza B by PCR NEGATIVE NEGATIVE Final    Comment: (NOTE) The Xpert Xpress SARS-CoV-2/FLU/RSV plus assay is intended as an aid in the diagnosis of influenza from Nasopharyngeal swab specimens and should not be used as a sole basis for treatment. Nasal washings and aspirates are unacceptable for Xpert Xpress SARS-CoV-2/FLU/RSV testing.  Fact Sheet for Patients: bloggercourse.com  Fact Sheet for Healthcare Providers: seriousbroker.it  This test is not yet approved or cleared by the [place]  FDA and has been  authorized for detection and/or diagnosis of SARS-CoV-2 by FDA under an Emergency Use Authorization (EUA). This EUA will remain in effect (meaning this test can be used) for the duration of the COVID-19 declaration under Section 564(b)(1) of the Act, 21  U.S.C. section 360bbb-3(b)(1), unless the authorization is terminated or revoked.     Resp Syncytial Virus by PCR NEGATIVE NEGATIVE Final    Comment: (NOTE) Fact Sheet for Patients: bloggercourse.com  Fact Sheet for Healthcare Providers: seriousbroker.it  This test is not yet approved or cleared by the [place]  FDA and has been authorized for detection and/or diagnosis of SARS-CoV-2 by FDA under an Emergency Use Authorization (EUA). This EUA will remain in effect (meaning this test can be used) for the duration of the COVID-19 declaration under Section 564(b)(1) of the Act, 21 U.S.C. section 360bbb-3(b)(1), unless the authorization is terminated or revoked.  Performed at [facility], [address], [place], [place] [address]   Culture, blood (Routine x 2)     Status: None (Preliminary result)   Collection Time: 09/09/24 12:48 AM   Specimen: BLOOD RIGHT ARM  Result Value Ref Range Status   Specimen Description   Final    BLOOD RIGHT ARM Performed at [facility], [address], [place], [place] [address]    Special Requests   Final    BOTTLES DRAWN AEROBIC AND ANAEROBIC Blood Culture results may not be optimal due to an inadequate volume of blood received in culture bottles Performed at [facility], [address] [place], [place] [address]    Culture   Final    NO GROWTH 2 DAYS Performed at [facility], [address] [place], [place] [address]    Report Status PENDING  Incomplete      Radiology Studies: No results found.      LOS: 3 days   Dailey Buccheri [employer] on  [email or web address]  09/11/2024, 10:58 AM

## 2024-09-11 NOTE — Plan of Care (Signed)
°  Problem: Nutritional: Goal: Maintenance of adequate nutrition will improve Outcome: Progressing   Problem: Skin Integrity: Goal: Risk for impaired skin integrity will decrease Outcome: Progressing   Problem: Nutrition: Goal: Adequate nutrition will be maintained Outcome: Progressing   Problem: Coping: Goal: Level of anxiety will decrease Outcome: Progressing   Problem: Safety: Goal: Ability to remain free from injury will improve Outcome: Progressing   Problem: Respiratory: Goal: Ability to maintain adequate ventilation will improve Outcome: Progressing

## 2024-09-12 LAB — GLUCOSE, CAPILLARY
Glucose-Capillary: 219 mg/dL — ABNORMAL HIGH (ref 70–99)
Glucose-Capillary: 217 mg/dL — ABNORMAL HIGH (ref 70–99)
Glucose-Capillary: 231 mg/dL — ABNORMAL HIGH (ref 70–99)

## 2024-09-12 MED ORDER — PHENAZOPYRIDINE HCL 100 MG PO TABS
100.0000 mg | ORAL_TABLET | Freq: Three times a day (TID) | ORAL | Status: DC
  Administered 2024-09-12 – 2024-09-14 (×7): 100 mg via ORAL
  Filled 2024-09-12 (×9): qty 1

## 2024-09-12 NOTE — Plan of Care (Signed)
   Problem: Education: Goal: Ability to describe self-care measures that may prevent or decrease complications (Diabetes Survival Skills Education) will improve Outcome: Progressing Goal: Individualized Educational Video(s) Outcome: Progressing   Problem: Coping: Goal: Ability to adjust to condition or change in health will improve Outcome: Progressing

## 2024-09-12 NOTE — Progress Notes (Signed)
 PROGRESS NOTE    Jeffery Stewart  FMW:[id number] DOB: [birth year] DOA: 09/08/2024 PCP: Nedra Tinnie LABOR, NP   Brief Narrative:  88 y.o. male with medical history significant of advanced dementia, type 2 diabetes, neuropathy, postpolio syndrome, recent fall at home resulting in left humerus fracture for which he was placed in a sling and sent home from the ED with subsequent evaluation by orthopedics in the office recommended conservative management.  He presented with increasing lethargy and low-grade fever and was admitted for pneumonia and started on IV antibiotics.  Assessment & Plan:   Sepsis: Present on admission Possible aspiration pneumonia: Present on admission - Initially treated with Rocephin  and Zithromax  but has only been switched to Augmentin  and Zithromax . -Diet as per SLP recommendations. - Currently hemodynamically stable with no temperature spikes.  Blood cultures negative so far.  Sepsis has resolved.  On room air. - On presentation, CT of the abdomen and pelvis did not show any acute findings except for constipation  History of advanced dementia with behavioral disturbance/acute metabolic encephalopathy - Mental status currently stable and back to baseline.  Monitor mental status.  Fall precautions/delirium precautions.  CT of the head did not show any acute findings on presentation - Continue duloxetine   Acute urinary retention - Foley catheter placed 2 days ago.  Will give voiding trial tomorrow.  Continue Flomax   Diabetes mellitus type 2 with hyperglycemia Plan continue CBGs with SSI along with linagliptin  and metformin   Leukocytosis - Resolved  CKD stage IIIb - Creatinine currently at baseline.  Monitor intermittently  Anemia of chronic disease - From chronic illnesses.  Hemoglobin stable.  Monitor intermittently  Hyponatremia - Mild.  No labs today.  Monitor intermittently  Left humerus fracture - Secondary to recent fall at home.  Seen by orthopedics  recently: Recommended conservative management.  Continue with sling.  Fall precautions.   DVT prophylaxis: Lovenox  Code Status: DNR Family Communication: Wife and daughter at bedside Disposition Plan: Status is: Inpatient Remains inpatient appropriate because: Of severity of illness.  Currently medically stable for discharge to SNF    Consultants: None  Procedures: None  Antimicrobials:  Anti-infectives (From admission, onward)    Start     Dose/Rate Route Frequency Ordered Stop   09/11/24 2200  amoxicillin -clavulanate (AUGMENTIN ) 875-125 MG per tablet 1 tablet        1 tablet Oral Every 12 hours 09/11/24 1102     09/09/24 1800  azithromycin  (ZITHROMAX ) tablet 500 mg        500 mg Oral Daily 09/08/24 2044 09/14/24 0959   09/09/24 0600  cefTRIAXone  (ROCEPHIN ) 2 g in sodium chloride  0.9 % 100 mL IVPB  Status:  Discontinued        2 g 200 mL/hr over 30 Minutes Intravenous Every 24 hours 09/08/24 2044 09/11/24 1102   09/08/24 1900  azithromycin  (ZITHROMAX ) 500 mg in sodium chloride  0.9 % 250 mL IVPB        500 mg 250 mL/hr over 60 Minutes Intravenous  Once 09/08/24 1849 09/08/24 2102   09/08/24 1700  ceFEPIme  (MAXIPIME ) 2 g in sodium chloride  0.9 % 100 mL IVPB        2 g 200 mL/hr over 30 Minutes Intravenous  Once 09/08/24 1655 09/08/24 1801        Subjective: Patient seen and examined at bedside.  No agitation, fever or vomiting reported.  Poor historian.  Objective: Vitals:   09/11/24 1155 09/11/24 1700 09/11/24 2105 09/12/24 0438  BP: 136/76  126/69 138/74  Pulse: 79  72 76  Resp: 18  18 18   Temp: 98.9 F (37.2 C) 99.8 F (37.7 C) 98.5 F (36.9 C) 98.4 F (36.9 C)  TempSrc:  Oral Oral Oral  SpO2: 95%  95% 95%  Weight:      Height:        Intake/Output Summary (Last 24 hours) at 09/12/2024 1058 Last data filed at 09/12/2024 0500 Gross per 24 hour  Intake 360 ml  Output 1250 ml  Net -890 ml   Filed Weights   09/08/24 1621  Weight: 67.5 kg     Examination:  General exam: Appears calm and comfortable.  Elderly male lying in bed. Respiratory system: Bilateral decreased breath sounds at bases with some scattered crackles Cardiovascular system: S1 & S2 heard, Rate controlled Gastrointestinal system: Abdomen is nondistended, soft and nontender. Normal bowel sounds heard. Extremities: No cyanosis, clubbing, edema  Central nervous system: Awake, confused, poor historian.  Slow to respond.  No focal neurological deficits. Moving extremities Skin: No rashes, lesions or ulcers Psychiatry: Flat affect.  Not agitated Genitourinary: Foley catheter present  Data Reviewed: I have personally reviewed following labs and imaging studies  CBC: Recent Labs  Lab 09/08/24 1638 09/09/24 0048 09/10/24 0917 09/11/24 0658  WBC 14.9* 16.1* 9.4 7.6  NEUTROABS 13.0*  --   --   --   HGB 11.4* 11.7* 10.9* 10.5*  HCT 35.6* 35.0* 33.1* 31.8*  MCV 92.2 90.9 92.5 92.7  PLT 301 349 310 341   Basic Metabolic Panel: Recent Labs  Lab 09/08/24 1638 09/09/24 0048 09/10/24 0550 09/11/24 0658  NA 135 136 137 134*  K 5.3* 4.8 4.7 4.6  CL 99 102 102 101  CO2 24 20* 27 20*  GLUCOSE 292* 253* 191* 213*  BUN 31* 27* 24* 17  CREATININE 1.44* 1.31* 1.32* 1.18  CALCIUM  9.1 9.1 8.7* 8.6*  MG  --   --  2.3  --    GFR: Estimated Creatinine Clearance: 40.6 mL/min (by C-G formula based on SCr of 1.18 mg/dL). Liver Function Tests: Recent Labs  Lab 09/08/24 1638  AST 14*  ALT 6  ALKPHOS 55  BILITOT 0.3  PROT 6.4*  ALBUMIN 3.7   No results for input(s): LIPASE, AMYLASE in the last 168 hours. No results for input(s): AMMONIA in the last 168 hours. Coagulation Profile: Recent Labs  Lab 09/08/24 1638  INR 1.0   Cardiac Enzymes: No results for input(s): CKTOTAL, CKMB, CKMBINDEX, TROPONINI in the last 168 hours. BNP (last 3 results) Recent Labs    06/28/24 1619  PROBNP 44.0   HbA1C: No results for input(s): HGBA1C in the  last 72 hours. CBG: Recent Labs  Lab 09/11/24 0753 09/11/24 1152 09/11/24 1650 09/11/24 2102 09/12/24 0759  GLUCAP 261* 226* 247* 263* 219*   Lipid Profile: No results for input(s): CHOL, HDL, LDLCALC, TRIG, CHOLHDL, LDLDIRECT in the last 72 hours. Thyroid  Function Tests: No results for input(s): TSH, T4TOTAL, FREET4, T3FREE, THYROIDAB in the last 72 hours. Anemia Panel: No results for input(s): VITAMINB12, FOLATE, FERRITIN, TIBC, IRON, RETICCTPCT in the last 72 hours. Sepsis Labs: Recent Labs  Lab 09/08/24 1640 09/08/24 1859 09/09/24 0048  PROCALCITON  --   --  0.19  LATICACIDVEN 3.3* 2.9*  --     Recent Results (from the past 240 hours)  Culture, blood (Routine x 2)     Status: None (Preliminary result)   Collection Time: 09/08/24  4:29 PM   Specimen: BLOOD  Result Value Ref Range  Status   Specimen Description   Final    BLOOD RIGHT ANTECUBITAL Performed at [facility], [address] [place], [place] [address]    Special Requests   Final    BOTTLES DRAWN AEROBIC AND ANAEROBIC Blood Culture results may not be optimal due to an inadequate volume of blood received in culture bottles Performed at [facility], [address] [place], [place] [address]    Culture   Final    NO GROWTH 4 DAYS Performed at [facility], [address] [place], [place] [address]    Report Status PENDING  Incomplete  Resp panel by RT-PCR (RSV, Flu A&B, Covid) Anterior Nasal Swab     Status: None   Collection Time: 09/08/24  6:26 PM   Specimen: Anterior Nasal Swab  Result Value Ref Range Status   SARS Coronavirus 2 by RT PCR NEGATIVE NEGATIVE Final    Comment: (NOTE) SARS-CoV-2 target nucleic acids are NOT DETECTED.  The SARS-CoV-2 RNA is generally detectable in upper respiratory specimens during the acute phase of infection. The lowest concentration of SARS-CoV-2 viral copies this assay can detect is 138  copies/mL. A negative result does not preclude SARS-Cov-2 infection and should not be used as the sole basis for treatment or other patient management decisions. A negative result may occur with  improper specimen collection/handling, submission of specimen other than nasopharyngeal swab, presence of viral mutation(s) within the areas targeted by this assay, and inadequate number of viral copies(<138 copies/mL). A negative result must be combined with clinical observations, patient history, and epidemiological information. The expected result is Negative.  Fact Sheet for Patients:  bloggercourse.com  Fact Sheet for Healthcare Providers:  seriousbroker.it  This test is no t yet approved or cleared by the [place]  FDA and  has been authorized for detection and/or diagnosis of SARS-CoV-2 by FDA under an Emergency Use Authorization (EUA). This EUA will remain  in effect (meaning this test can be used) for the duration of the COVID-19 declaration under Section 564(b)(1) of the Act, 21 U.S.C.section 360bbb-3(b)(1), unless the authorization is terminated  or revoked sooner.       Influenza A by PCR NEGATIVE NEGATIVE Final   Influenza B by PCR NEGATIVE NEGATIVE Final    Comment: (NOTE) The Xpert Xpress SARS-CoV-2/FLU/RSV plus assay is intended as an aid in the diagnosis of influenza from Nasopharyngeal swab specimens and should not be used as a sole basis for treatment. Nasal washings and aspirates are unacceptable for Xpert Xpress SARS-CoV-2/FLU/RSV testing.  Fact Sheet for Patients: bloggercourse.com  Fact Sheet for Healthcare Providers: seriousbroker.it  This test is not yet approved or cleared by the [place]  FDA and has been authorized for detection and/or diagnosis of SARS-CoV-2 by FDA under an Emergency Use Authorization (EUA). This EUA will remain in effect (meaning  this test can be used) for the duration of the COVID-19 declaration under Section 564(b)(1) of the Act, 21 U.S.C. section 360bbb-3(b)(1), unless the authorization is terminated or revoked.     Resp Syncytial Virus by PCR NEGATIVE NEGATIVE Final    Comment: (NOTE) Fact Sheet for Patients: bloggercourse.com  Fact Sheet for Healthcare Providers: seriousbroker.it  This test is not yet approved or cleared by the [place]  FDA and has been authorized for detection and/or diagnosis of SARS-CoV-2 by FDA under an Emergency Use Authorization (EUA). This EUA will remain in effect (meaning this test can be used) for the duration of the COVID-19 declaration under Section  564(b)(1) of the Act, 21 U.S.C. section 360bbb-3(b)(1), unless the authorization is terminated or revoked.  Performed at [facility], [address] [place], [place] [address]   Culture, blood (Routine x 2)     Status: None (Preliminary result)   Collection Time: 09/09/24 12:48 AM   Specimen: BLOOD RIGHT ARM  Result Value Ref Range Status   Specimen Description   Final    BLOOD RIGHT ARM Performed at [facility], [address] [place], [place] [address]    Special Requests   Final    BOTTLES DRAWN AEROBIC AND ANAEROBIC Blood Culture results may not be optimal due to an inadequate volume of blood received in culture bottles Performed at [facility], [address] [place], [place] [address]    Culture   Final    NO GROWTH 3 DAYS Performed at [facility], [address] [place], [place] [address]    Report Status PENDING  Incomplete         Radiology Studies: No results found.      Scheduled Meds:  acetaminophen   650 mg Rectal Once   amoxicillin -clavulanate  1 tablet Oral Q12H   azithromycin   500 mg Oral Daily   Chlorhexidine  Gluconate Cloth  6 each Topical Daily   cholecalciferol   2,000 Units  Oral Daily   clonazePAM   1 mg Oral QHS   vitamin B-12  2,000 mcg Oral Daily   cycloSPORINE   1 drop Both Eyes BID   docusate sodium   100 mg Oral BID   DULoxetine   60 mg Oral Daily   enoxaparin  (LOVENOX ) injection  40 mg Subcutaneous Q24H   famotidine   20 mg Oral BID   gabapentin   300 mg Oral TID   insulin  aspart  0-6 Units Subcutaneous TID WC   linagliptin   5 mg Oral Daily   metFORMIN   1,000 mg Oral BID WC   phenazopyridine   100 mg Oral TID WC   polyethylene glycol  17 g Oral Daily   tamsulosin   0.4 mg Oral Daily   zolpidem   5 mg Oral QHS   Continuous Infusions:        Sophie Mao, MD [facility] 09/12/2024, 10:58 AM

## 2024-09-12 NOTE — Plan of Care (Signed)
°  Problem: Coping: Goal: Ability to adjust to condition or change in health will improve Outcome: Progressing   Problem: Metabolic: Goal: Ability to maintain appropriate glucose levels will improve Outcome: Progressing   Problem: Skin Integrity: Goal: Risk for impaired skin integrity will decrease Outcome: Progressing   Problem: Nutrition: Goal: Adequate nutrition will be maintained Outcome: Progressing

## 2024-09-13 LAB — CULTURE, BLOOD (ROUTINE X 2): Culture: NO GROWTH

## 2024-09-13 LAB — GLUCOSE, CAPILLARY
Glucose-Capillary: 207 mg/dL — ABNORMAL HIGH (ref 70–99)
Glucose-Capillary: 241 mg/dL — ABNORMAL HIGH (ref 70–99)
Glucose-Capillary: 206 mg/dL — ABNORMAL HIGH (ref 70–99)
Glucose-Capillary: 122 mg/dL — ABNORMAL HIGH (ref 70–99)
Glucose-Capillary: 258 mg/dL — ABNORMAL HIGH (ref 70–99)

## 2024-09-13 MED ORDER — INSULIN ASPART 100 UNIT/ML IJ SOLN
0.0000 [IU] | Freq: Three times a day (TID) | INTRAMUSCULAR | Status: DC
  Administered 2024-09-13: 18:00:00 2 [IU] via SUBCUTANEOUS
  Administered 2024-09-13 – 2024-09-15 (×5): 5 [IU] via SUBCUTANEOUS
  Administered 2024-09-16: 08:00:00 1 [IU] via SUBCUTANEOUS
  Filled 2024-09-13: qty 1
  Filled 2024-09-13 (×2): qty 5
  Filled 2024-09-13: qty 3
  Filled 2024-09-13: qty 2
  Filled 2024-09-13: qty 3
  Filled 2024-09-13 (×2): qty 5

## 2024-09-13 MED ORDER — BISACODYL 10 MG RE SUPP
10.0000 mg | Freq: Every day | RECTAL | Status: DC | PRN
  Filled 2024-09-13: qty 1

## 2024-09-13 MED ORDER — INSULIN ASPART 100 UNIT/ML IJ SOLN
0.0000 [IU] | Freq: Every day | INTRAMUSCULAR | Status: DC
  Administered 2024-09-13: 23:00:00 3 [IU] via SUBCUTANEOUS
  Filled 2024-09-13: qty 3

## 2024-09-13 NOTE — Progress Notes (Signed)
 PROGRESS NOTE    Edmund Holcomb  FMW:[id number] DOB: 1935-11-26 DOA: 09/08/2024 PCP: Nedra Tinnie LABOR, NP   Brief Narrative:  88 y.o. male with medical history significant of advanced dementia, type 2 diabetes, neuropathy, postpolio syndrome, recent fall at home resulting in left humerus fracture for which he was placed in a sling and sent home from the ED with subsequent evaluation by orthopedics in the office recommended conservative management.  He presented with increasing lethargy and low-grade fever and was admitted for pneumonia and started on IV antibiotics.  Assessment & Plan:   Sepsis: Present on admission Possible aspiration pneumonia: Present on admission - Initially treated with Rocephin  and Zithromax  but has only been switched to Augmentin  and Zithromax . -Diet as per SLP recommendations. - Currently hemodynamically stable with no temperature spikes.  Blood cultures negative so far.  Sepsis has resolved.  On room air. - On presentation, CT of the abdomen and pelvis did not show any acute findings except for constipation  History of advanced dementia with behavioral disturbance/acute metabolic encephalopathy - Mental status currently stable and back to baseline.  Monitor mental status.  Fall precautions/delirium precautions.  CT of the head did not show any acute findings on presentation - Continue duloxetine   Acute urinary retention - Foley catheter placed 2 days ago.  Will give voiding trial today.  Continue Flomax   Diabetes mellitus type 2 with hyperglycemia -continue CBGs with SSI along with linagliptin  and metformin .  Carb modified diet  Leukocytosis - Resolved  CKD stage IIIb - Creatinine currently at baseline.  Monitor intermittently  Anemia of chronic disease - From chronic illnesses.  Hemoglobin stable.  Monitor intermittently  Hyponatremia - Mild.  No labs today.  Monitor intermittently  Left humerus fracture - Secondary to recent fall at home.  Seen by  orthopedics recently: Recommended conservative management.  Continue with sling.  Fall precautions.   DVT prophylaxis: Lovenox  Code Status: DNR Family Communication: Wife and daughter at bedside Disposition Plan: Status is: Inpatient Remains inpatient appropriate because: Of severity of illness.  Currently medically stable for discharge to SNF    Consultants: None  Procedures: None  Antimicrobials:  Anti-infectives (From admission, onward)    Start     Dose/Rate Route Frequency Ordered Stop   09/11/24 2200  amoxicillin -clavulanate (AUGMENTIN ) 875-125 MG per tablet 1 tablet        1 tablet Oral Every 12 hours 09/11/24 1102     09/09/24 1800  azithromycin  (ZITHROMAX ) tablet 500 mg        500 mg Oral Daily 09/08/24 2044 09/13/24 0925   09/09/24 0600  cefTRIAXone  (ROCEPHIN ) 2 g in sodium chloride  0.9 % 100 mL IVPB  Status:  Discontinued        2 g 200 mL/hr over 30 Minutes Intravenous Every 24 hours 09/08/24 2044 09/11/24 1102   09/08/24 1900  azithromycin  (ZITHROMAX ) 500 mg in sodium chloride  0.9 % 250 mL IVPB        500 mg 250 mL/hr over 60 Minutes Intravenous  Once 09/08/24 1849 09/08/24 2102   09/08/24 1700  ceFEPIme  (MAXIPIME ) 2 g in sodium chloride  0.9 % 100 mL IVPB        2 g 200 mL/hr over 30 Minutes Intravenous  Once 09/08/24 1655 09/08/24 1801        Subjective: Patient seen and examined at bedside.  Poor historian.  No fever, vomiting reported. Objective: Vitals:   09/12/24 0438 09/12/24 1204 09/12/24 2017 09/13/24 0447  BP: 138/74 (!) 145/74 (!) 140/67  137/69  Pulse: 76 82 79 71  Resp: 18 20 18 18   Temp: 98.4 F (36.9 C)  98.8 F (37.1 C) 98.8 F (37.1 C)  TempSrc: Oral  Oral Oral  SpO2: 95% 93% 93% 97%  Weight:      Height:        Intake/Output Summary (Last 24 hours) at 09/13/2024 1020 Last data filed at 09/13/2024 0500 Gross per 24 hour  Intake --  Output 1100 ml  Net -1100 ml   Filed Weights   09/08/24 1621  Weight: 67.5 kg     Examination:  General: On room air.  No distress ENT/neck: No thyromegaly.  JVD is not elevated  respiratory: Decreased breath sounds at bases bilaterally with some crackles; no wheezing  CVS: S1-S2 heard, rate controlled currently Abdominal: Soft, nontender, slightly distended; no organomegaly, bowel sounds are heard Genitourinary: Has a Foley catheter Extremities: Trace lower extremity edema; no cyanosis  CNS: Awake, remains confused and a poor historian.  No focal neurologic deficit.  Moves extremities Lymph: No obvious lymphadenopathy Skin: No obvious ecchymosis/lesions  psych: Mostly flat affect.  Currently not agitated  musculoskeletal: No obvious joint swelling/deformity   Data Reviewed: I have personally reviewed following labs and imaging studies  CBC: Recent Labs  Lab 09/08/24 1638 09/09/24 0048 09/10/24 0917 09/11/24 0658  WBC 14.9* 16.1* 9.4 7.6  NEUTROABS 13.0*  --   --   --   HGB 11.4* 11.7* 10.9* 10.5*  HCT 35.6* 35.0* 33.1* 31.8*  MCV 92.2 90.9 92.5 92.7  PLT 301 349 310 341   Basic Metabolic Panel: Recent Labs  Lab 09/08/24 1638 09/09/24 0048 09/10/24 0550 09/11/24 0658  NA 135 136 137 134*  K 5.3* 4.8 4.7 4.6  CL 99 102 102 101  CO2 24 20* 27 20*  GLUCOSE 292* 253* 191* 213*  BUN 31* 27* 24* 17  CREATININE 1.44* 1.31* 1.32* 1.18  CALCIUM  9.1 9.1 8.7* 8.6*  MG  --   --  2.3  --    GFR: Estimated Creatinine Clearance: 40.6 mL/min (by C-G formula based on SCr of 1.18 mg/dL). Liver Function Tests: Recent Labs  Lab 09/08/24 1638  AST 14*  ALT 6  ALKPHOS 55  BILITOT 0.3  PROT 6.4*  ALBUMIN 3.7   No results for input(s): LIPASE, AMYLASE in the last 168 hours. No results for input(s): AMMONIA in the last 168 hours. Coagulation Profile: Recent Labs  Lab 09/08/24 1638  INR 1.0   Cardiac Enzymes: No results for input(s): CKTOTAL, CKMB, CKMBINDEX, TROPONINI in the last 168 hours. BNP (last 3 results) Recent Labs     06/28/24 1619  PROBNP 44.0   HbA1C: No results for input(s): HGBA1C in the last 72 hours. CBG: Recent Labs  Lab 09/11/24 2102 09/12/24 0759 09/12/24 1626 09/12/24 2058 09/13/24 0735  GLUCAP 263* 219* 217* 231* 241*   Lipid Profile: No results for input(s): CHOL, HDL, LDLCALC, TRIG, CHOLHDL, LDLDIRECT in the last 72 hours. Thyroid  Function Tests: No results for input(s): TSH, T4TOTAL, FREET4, T3FREE, THYROIDAB in the last 72 hours. Anemia Panel: No results for input(s): VITAMINB12, FOLATE, FERRITIN, TIBC, IRON, RETICCTPCT in the last 72 hours. Sepsis Labs: Recent Labs  Lab 09/08/24 1640 09/08/24 1859 09/09/24 0048  PROCALCITON  --   --  0.19  LATICACIDVEN 3.3* 2.9*  --     Recent Results (from the past 240 hours)  Culture, blood (Routine x 2)     Status: None   Collection Time: 09/08/24  4:29 PM   Specimen: BLOOD  Result Value Ref Range Status   Specimen Description   Final    BLOOD RIGHT ANTECUBITAL Performed at [facility], [address] [place], [place] [address]    Special Requests   Final    BOTTLES DRAWN AEROBIC AND ANAEROBIC Blood Culture results may not be optimal due to an inadequate volume of blood received in culture bottles Performed at [facility], [address], [place], [place] [address]    Culture   Final    NO GROWTH 5 DAYS Performed at [facility], [address] [place], [place] [address]    Report Status 09/13/2024 FINAL  Final  Resp panel by RT-PCR (RSV, Flu A&B, Covid) Anterior Nasal Swab     Status: None   Collection Time: 09/08/24  6:26 PM   Specimen: Anterior Nasal Swab  Result Value Ref Range Status   SARS Coronavirus 2 by RT PCR NEGATIVE NEGATIVE Final    Comment: (NOTE) SARS-CoV-2 target nucleic acids are NOT DETECTED.  The SARS-CoV-2 RNA is generally detectable in upper respiratory specimens during the acute phase of infection. The  lowest concentration of SARS-CoV-2 viral copies this assay can detect is 138 copies/mL. A negative result does not preclude SARS-Cov-2 infection and should not be used as the sole basis for treatment or other patient management decisions. A negative result may occur with  improper specimen collection/handling, submission of specimen other than nasopharyngeal swab, presence of viral mutation(s) within the areas targeted by this assay, and inadequate number of viral copies(<138 copies/mL). A negative result must be combined with clinical observations, patient history, and epidemiological information. The expected result is Negative.  Fact Sheet for Patients:  bloggercourse.com  Fact Sheet for Healthcare Providers:  seriousbroker.it  This test is no t yet approved or cleared by the [place]  FDA and  has been authorized for detection and/or diagnosis of SARS-CoV-2 by FDA under an Emergency Use Authorization (EUA). This EUA will remain  in effect (meaning this test can be used) for the duration of the COVID-19 declaration under Section 564(b)(1) of the Act, 21 U.S.C.section 360bbb-3(b)(1), unless the authorization is terminated  or revoked sooner.       Influenza A by PCR NEGATIVE NEGATIVE Final   Influenza B by PCR NEGATIVE NEGATIVE Final    Comment: (NOTE) The Xpert Xpress SARS-CoV-2/FLU/RSV plus assay is intended as an aid in the diagnosis of influenza from Nasopharyngeal swab specimens and should not be used as a sole basis for treatment. Nasal washings and aspirates are unacceptable for Xpert Xpress SARS-CoV-2/FLU/RSV testing.  Fact Sheet for Patients: bloggercourse.com  Fact Sheet for Healthcare Providers: seriousbroker.it  This test is not yet approved or cleared by the [place]  FDA and has been authorized for detection and/or diagnosis of SARS-CoV-2 by FDA under  an Emergency Use Authorization (EUA). This EUA will remain in effect (meaning this test can be used) for the duration of the COVID-19 declaration under Section 564(b)(1) of the Act, 21 U.S.C. section 360bbb-3(b)(1), unless the authorization is terminated or revoked.     Resp Syncytial Virus by PCR NEGATIVE NEGATIVE Final    Comment: (NOTE) Fact Sheet for Patients: bloggercourse.com  Fact Sheet for Healthcare Providers: seriousbroker.it  This test is not yet approved or cleared by the [place]  FDA and has been authorized for detection and/or diagnosis of SARS-CoV-2 by FDA under an Emergency Use Authorization (EUA). This EUA will remain in effect (meaning this test  can be used) for the duration of the COVID-19 declaration under Section 564(b)(1) of the Act, 21 U.S.C. section 360bbb-3(b)(1), unless the authorization is terminated or revoked.  Performed at [facility], [address] [place], [place] [address]   Culture, blood (Routine x 2)     Status: None (Preliminary result)   Collection Time: 09/09/24 12:48 AM   Specimen: BLOOD RIGHT ARM  Result Value Ref Range Status   Specimen Description   Final    BLOOD RIGHT ARM Performed at [facility], [address] [place], [place] [address]    Special Requests   Final    BOTTLES DRAWN AEROBIC AND ANAEROBIC Blood Culture results may not be optimal due to an inadequate volume of blood received in culture bottles Performed at [facility], [address] [place], [place] [address]    Culture   Final    NO GROWTH 4 DAYS Performed at [facility], [address] [place], [place] [address]    Report Status PENDING  Incomplete         Radiology Studies: No results found.      Scheduled Meds:  acetaminophen   650 mg Rectal Once   amoxicillin -clavulanate  1 tablet Oral Q12H   Chlorhexidine  Gluconate Cloth  6 each  Topical Daily   cholecalciferol   2,000 Units Oral Daily   clonazePAM   1 mg Oral QHS   vitamin B-12  2,000 mcg Oral Daily   cycloSPORINE   1 drop Both Eyes BID   docusate sodium   100 mg Oral BID   DULoxetine   60 mg Oral Daily   enoxaparin  (LOVENOX ) injection  40 mg Subcutaneous Q24H   famotidine   20 mg Oral BID   gabapentin   300 mg Oral TID   insulin  aspart  0-6 Units Subcutaneous TID WC   linagliptin   5 mg Oral Daily   metFORMIN   1,000 mg Oral BID WC   phenazopyridine   100 mg Oral TID WC   polyethylene glycol  17 g Oral Daily   tamsulosin   0.4 mg Oral Daily   zolpidem   5 mg Oral QHS   Continuous Infusions:        Sophie Mao, MD [facility] 09/13/2024, 10:20 AM

## 2024-09-13 NOTE — Plan of Care (Signed)
°  Problem: Education: Goal: Ability to describe self-care measures that may prevent or decrease complications (Diabetes Survival Skills Education) will improve Outcome: Progressing Goal: Individualized Educational Video(s) Outcome: Progressing   Problem: Coping: Goal: Ability to adjust to condition or change in health will improve Outcome: Progressing   Problem: Fluid Volume: Goal: Ability to maintain a balanced intake and output will improve Outcome: Progressing   Problem: Health Behavior/Discharge Planning: Goal: Ability to identify and utilize available resources and services will improve Outcome: Progressing Goal: Ability to manage health-related needs will improve Outcome: Progressing   Problem: Metabolic: Goal: Ability to maintain appropriate glucose levels will improve Outcome: Progressing   Problem: Nutritional: Goal: Maintenance of adequate nutrition will improve Outcome: Progressing Goal: Progress toward achieving an optimal weight will improve Outcome: Progressing   Problem: Skin Integrity: Goal: Risk for impaired skin integrity will decrease Outcome: Progressing   Problem: Tissue Perfusion: Goal: Adequacy of tissue perfusion will improve Outcome: Progressing   Problem: Tissue Perfusion: Goal: Adequacy of tissue perfusion will improve Outcome: Progressing   Problem: Education: Goal: Knowledge of General Education information will improve Description: Including pain rating scale, medication(s)/side effects and non-pharmacologic comfort measures Outcome: Progressing   Problem: Health Behavior/Discharge Planning: Goal: Ability to manage health-related needs will improve Outcome: Progressing   Problem: Clinical Measurements: Goal: Ability to maintain clinical measurements within normal limits will improve Outcome: Progressing Goal: Will remain free from infection Outcome: Progressing Goal: Diagnostic test results will improve Outcome: Progressing Goal:  Respiratory complications will improve Outcome: Progressing Goal: Cardiovascular complication will be avoided Outcome: Progressing   Problem: Activity: Goal: Risk for activity intolerance will decrease Outcome: Progressing   Problem: Nutrition: Goal: Adequate nutrition will be maintained Outcome: Progressing   Problem: Coping: Goal: Level of anxiety will decrease Outcome: Progressing   Problem: Elimination: Goal: Will not experience complications related to bowel motility Outcome: Progressing Goal: Will not experience complications related to urinary retention Outcome: Progressing   Problem: Pain Managment: Goal: General experience of comfort will improve and/or be controlled Outcome: Progressing   Problem: Safety: Goal: Ability to remain free from injury will improve Outcome: Progressing   Problem: Skin Integrity: Goal: Risk for impaired skin integrity will decrease Outcome: Progressing   Problem: Activity: Goal: Ability to tolerate increased activity will improve Outcome: Progressing   Problem: Clinical Measurements: Goal: Ability to maintain a body temperature in the normal range will improve Outcome: Progressing   Problem: Respiratory: Goal: Ability to maintain adequate ventilation will improve Outcome: Progressing Goal: Ability to maintain a clear airway will improve Outcome: Progressing

## 2024-09-13 NOTE — TOC Progression Note (Addendum)
 Transition of Care [facility]) - Progression Note    Patient Details  Name: Jeffery Stewart MRN: [id number] Date of Birth: 1936/07/30  [facility]) CM/SW Contact  Dencil Cayson, Nathanel, RN Phone Number: 09/13/2024, 11:30 AM  Clinical Narrative:   Will need PT to see prior auth for [facility]. -1p-Noted for palliative cons-await recc.      Expected Discharge Plan: Skilled Nursing Facility Barriers to Discharge: Continued Medical Work up               Expected Discharge Plan and Services In-house Referral: NA Discharge Planning Services: CM Consult Post Acute Care Choice: Skilled Nursing Facility Living arrangements for the past 2 months: Single Family Home                 DME Arranged: N/A DME Agency: NA       HH Arranged: NA HH Agency: NA         Social Drivers of Health (SDOH) Interventions SDOH Screenings   Food Insecurity: No Food Insecurity (09/08/2024)  Housing: Unknown (09/08/2024)  Transportation Needs: No Transportation Needs (09/08/2024)  Utilities: Not At Risk (09/08/2024)  Depression (PHQ2-9): High Risk (04/05/2024)  Social Connections: Unknown (09/08/2024)  Tobacco Use: Low Risk (09/08/2024)    Readmission Risk Interventions    09/09/2024    4:56 PM  Readmission Risk Prevention Plan  Transportation Screening Complete  PCP or Specialist Appt within 5-7 Days Complete  Home Care Screening Complete  Medication Review (RN CM) Complete

## 2024-09-13 NOTE — Progress Notes (Signed)
 Physical Therapy Treatment Patient Details Name: Jeffery Stewart MRN: [id number] DOB: 06-28-1936 Today's Date: 09/13/2024   History of Present Illness Jeffery Stewart is a 88 y.o. male who lives at home with his wife.  He had a mechanical fall 2 days ago and suffered left humerus fracture.  Was seen in the emergency room and sent home with sling.  Patient went to see orthopedics yesterday, was advised conservative management. PMH: advanced dementia, type 2 diabetes, neuropathy, postpolio syndrome    PT Comments  Family present in patient's room-report pt had been a bit restless due to discomfort/pain. Pt's neck/shoulder strap off of sling due to it being uncomfortable for pt. Total assist for repositioning and readjusting sling and straps. Pt required +2 assist for all mobility. Pt was able to stand and take several steps at bedside with use of gait belt and hand held assist on R side. He was quite distracted by pain throughout session-L UE and scrotum. He participated well and followed 1 step commands with repeated multimodal cueing. Assisted pt back to bed at his request and repositioned for comfort. Pt resting at end of session. Family would like pt to wear a sling that they brought in-they feel it will be more comfortable for pt. Explained to family that as long as it is supportive and L UE will be positioned properly, it should be fine. Did not awaken pt to try it out this session since he was already back to bed and appeared comfortable. Will ask OT to assist with sling during next visit. Also discussed d/c plan with family-they report they are currently considering all options. Patient will benefit from continued inpatient follow up therapy, <3 hours/day.    If plan is discharge home, recommend the following: Two people to help with walking and/or transfers;A lot of help with bathing/dressing/bathroom;Assistance with feeding;Help with stairs or ramp for entrance;Assist for transportation   Can travel  by private vehicle     No  Equipment Recommendations   (TBD at next venue)    Recommendations for Other Services       Precautions / Restrictions Precautions Precautions: Fall Precaution/Restrictions Comments: NWB L shoulder in sling Required Braces or Orthoses: Sling Restrictions Weight Bearing Restrictions Per Provider Order: Yes LUE Weight Bearing Per Provider Order: Non weight bearing Other Position/Activity Restrictions: no formal weight bearing precautions noted for L humerus fracture, being treated conservatively; PT/OT at eval treated as NWB for safety and pain management     Mobility  Bed Mobility Overal bed mobility: Needs Assistance Bed Mobility: Supine to Sit, Sit to Supine   Supine to sit: Max assist, +2 for physical assistance, +2 for safety/equipment, HOB elevated, Used rails Supine to sit: Mod assist, +2 for physical assistance, +2 for safety/equipment, HOB elevated, Used rails     General bed mobility comments: heavy use of bed features. Max multimodal cueing required. Pt able to use bedrail once cued to and hand placed on rail. Utilized bedpad with scooting, positioning at EOB. Continual support to LUE to minimize pain with mobility-pt not wearing slling properly (family removed neck strap for comfort-pillow was under elbow for support). Posterior leaning with Mod-Min A for sitting balance.    Transfers Overall transfer level: Needs assistance Equipment used: 2 person hand held assist Transfers: Sit to/from Stand Sit to Stand: Mod assist, +2 physical assistance, +2 safety/equipment, From elevated surface           General transfer comment: Assist to power up, stabilize, control descent. Multimodal cueing required.  Pt distracted by pain. Fall risk.    Ambulation/Gait Ambulation/Gait assistance: Mod assist, +2 physical assistance, +2 safety/equipment   Assistive device: 2 person hand held assist         General Gait Details: Several side steps  along bedside with 2 person assist for stability and support. Multimodal cueing required. Pt distracted by pain. Pt able to weightbear and step all the way to HOB/bedrail. Fall risk.   Stairs             Wheelchair Mobility     Tilt Bed    Modified Rankin (Stroke Patients Only)       Balance Overall balance assessment: Needs assistance Sitting-balance support: Feet supported, Single extremity supported Sitting balance-Leahy Scale: Poor Sitting balance - Comments: Progressed from poor to fair with time Postural control: Posterior lean   Standing balance-Leahy Scale: Poor Standing balance comment: + 2 assist with use of gait belt and hand held assist on R side for steadying                            Communication Communication Communication: Impaired Factors Affecting Communication: Hearing impaired  Cognition Arousal: Alert Behavior During Therapy: WFL for tasks assessed/performed   PT - Cognitive impairments: History of cognitive impairments                       PT - Cognition Comments: dementia at baseline, family assists with subjective intake Following commands: Impaired Following commands impaired: Only follows one step commands consistently    Cueing Cueing Techniques: Verbal cues, Gestural cues, Tactile cues  Exercises      General Comments        Pertinent Vitals/Pain Pain Assessment Pain Assessment: Faces Faces Pain Scale: Hurts whole lot Pain Location: L shoulder, scrotum Pain Descriptors / Indicators: Grimacing, Guarding, Moaning Pain Intervention(s): Limited activity within patient's tolerance, Monitored during session, Repositioned    Home Living                          Prior Function            PT Goals (current goals can now be found in the care plan section) Progress towards PT goals: Progressing toward goals    Frequency    Min 2X/week      PT Plan      Co-evaluation               AM-PAC PT 6 Clicks Mobility   Outcome Measure  Help needed turning from your back to your side while in a flat bed without using bedrails?: Total Help needed moving from lying on your back to sitting on the side of a flat bed without using bedrails?: Total Help needed moving to and from a bed to a chair (including a wheelchair)?: Total Help needed standing up from a chair using your arms (e.g., wheelchair or bedside chair)?: Total Help needed to walk in hospital room?: Total Help needed climbing 3-5 steps with a railing? : Total 6 Click Score: 6    End of Session Equipment Utilized During Treatment: Gait belt Activity Tolerance: Patient limited by pain Patient left: in bed;with call bell/phone within reach;with bed alarm set;with family/visitor present   PT Visit Diagnosis: Unsteadiness on feet (R26.81);History of falling (Z91.81);Difficulty in walking, not elsewhere classified (R26.2)     Time: 8867-8844 PT Time Calculation (min) (ACUTE ONLY): 23 min  Charges:    $Therapeutic Activity: 23-37 mins PT General Charges $$ ACUTE PT VISIT: 1 Visit                        Dannial SQUIBB, PT Acute Rehabilitation  Office: [phone]

## 2024-09-13 NOTE — Plan of Care (Signed)
  Problem: Health Behavior/Discharge Planning: Goal: Ability to identify and utilize available resources and services will improve Outcome: Progressing   Problem: Metabolic: Goal: Ability to maintain appropriate glucose levels will improve Outcome: Progressing   Problem: Nutritional: Goal: Maintenance of adequate nutrition will improve Outcome: Progressing

## 2024-09-14 LAB — GLUCOSE, CAPILLARY
Glucose-Capillary: 236 mg/dL — ABNORMAL HIGH (ref 70–99)
Glucose-Capillary: 237 mg/dL — ABNORMAL HIGH (ref 70–99)
Glucose-Capillary: 155 mg/dL — ABNORMAL HIGH (ref 70–99)
Glucose-Capillary: 180 mg/dL — ABNORMAL HIGH (ref 70–99)

## 2024-09-14 LAB — CULTURE, BLOOD (ROUTINE X 2): Culture: NO GROWTH

## 2024-09-14 MED ORDER — KETOROLAC TROMETHAMINE 15 MG/ML IJ SOLN
15.0000 mg | Freq: Three times a day (TID) | INTRAMUSCULAR | Status: DC
  Administered 2024-09-14 – 2024-09-16 (×6): 15 mg via INTRAVENOUS
  Filled 2024-09-14 (×7): qty 1

## 2024-09-14 MED ORDER — ACETAMINOPHEN 500 MG PO TABS
1000.0000 mg | ORAL_TABLET | Freq: Three times a day (TID) | ORAL | Status: DC
  Administered 2024-09-14 – 2024-09-16 (×5): 1000 mg via ORAL
  Filled 2024-09-14 (×8): qty 2

## 2024-09-14 MED ORDER — MORPHINE SULFATE (CONCENTRATE) 10 MG /0.5 ML PO SOLN: 5.0000 mg | ORAL | Status: DC | PRN

## 2024-09-14 MED ORDER — MORPHINE SULFATE (PF) 2 MG/ML IV SOLN
2.0000 mg | INTRAVENOUS | Status: DC | PRN
  Administered 2024-09-14 – 2024-09-16 (×3): 2 mg via INTRAVENOUS
  Filled 2024-09-14 (×4): qty 1

## 2024-09-14 NOTE — Progress Notes (Signed)
 SLP Cancellation Note  Patient Details Name: Collyn Ribas MRN: [id number] DOB: January 07, 1936   Cancelled treatment:       Reason Eval/Treat Not Completed: Patient's level of consciousness Patient alert to voice but lethargic overall and per spouse, he recently got some pain medication. SLP provided brief education to spouse regarding EMST and left device in room. SLP will continue efforts.    Norleen IVAR Blase, MA, CCC-SLP Speech Therapy  09/14/2024, 1:50 PM

## 2024-09-14 NOTE — TOC Progression Note (Addendum)
 Transition of Care [facility]) - Progression Note    Patient Details  Name: Jeffery Stewart MRN: [id number] Date of Birth: 11/21/1935  Transition of Care [facility]) CM/SW Contact  Heather DELENA Saltness, LCSW Phone Number: 09/14/2024, 4:13 PM  Clinical Narrative:     ADDENDUM  Palliative care team recommending hospice care. Pt's spouse agreeable, planning to speak with sons tomorrow regarding home hospice/discharge plan. TOC will continue to follow.  Pt medically stable to discharge to SNF. CSW notified Tammy at [facility] of pt's acceptance of bed offer. Insurance authorization requested, currently pending. Awaiting palliative care recommendations. TOC will continue to follow.   Expected Discharge Plan: Skilled Nursing Facility Barriers to Discharge: Continued Medical Work up   Expected Discharge Plan and Services In-house Referral: NA Discharge Planning Services: CM Consult Post Acute Care Choice: Skilled Nursing Facility Living arrangements for the past 2 months: Single Family Home                 DME Arranged: N/A DME Agency: NA       HH Arranged: NA HH Agency: NA         Social Drivers of Health (SDOH) Interventions SDOH Screenings   Food Insecurity: No Food Insecurity (09/08/2024)  Housing: Unknown (09/08/2024)  Transportation Needs: No Transportation Needs (09/08/2024)  Utilities: Not At Risk (09/08/2024)  Depression (PHQ2-9): High Risk (04/05/2024)  Social Connections: Unknown (09/08/2024)  Tobacco Use: Low Risk (09/08/2024)    Readmission Risk Interventions    09/09/2024    4:56 PM  Readmission Risk Prevention Plan  Transportation Screening Complete  PCP or Specialist Appt within 5-7 Days Complete  Home Care Screening Complete  Medication Review (RN CM) Complete    Signed: Heather Saltness, MSW, LCSW Clinical Social Worker Inpatient Care Management 09/14/2024 4:16 PM

## 2024-09-14 NOTE — Progress Notes (Signed)
 PROGRESS NOTE    Jeffery Stewart  FMW:[id number] DOB: 1936-08-31 DOA: 09/08/2024 PCP: Nedra Tinnie LABOR, NP   Brief Narrative:  88 y.o. male with medical history significant of advanced dementia, type 2 diabetes, neuropathy, postpolio syndrome, recent fall at home resulting in left humerus fracture for which he was placed in a sling and sent home from the ED with subsequent evaluation by orthopedics in the office recommended conservative management.  He presented with increasing lethargy and low-grade fever and was admitted for pneumonia and started on IV antibiotics.  Assessment & Plan:   Sepsis: Present on admission Possible aspiration pneumonia: Present on admission - Initially treated with Rocephin  and Zithromax  but has only been switched to Augmentin  and Zithromax .  Discontinue antibiotics after today's doses. -Diet as per SLP recommendations. - Currently hemodynamically stable with no temperature spikes.  Blood cultures negative so far.  Sepsis has resolved.  On room air. - On presentation, CT of the abdomen and pelvis did not show any acute findings except for constipation  History of advanced dementia with behavioral disturbance/acute metabolic encephalopathy Goals of care - Mental status currently stable and back to baseline.  Monitor mental status.  Fall precautions/delirium precautions.  CT of the head did not show any acute findings on presentation - Continue duloxetine  - Family requested palliative care consultation.  Palliative care consultation has been requested.  Follow recommendations.  Acute urinary retention - Patient failed voiding trial on 09/13/2024 and Foley catheter had to be placed again.  Continue Flomax   Diabetes mellitus type 2 with hyperglycemia -continue CBGs with SSI along with linagliptin  and metformin .  Carb modified diet  Leukocytosis - Resolved  CKD stage IIIb - Creatinine currently at baseline.  Monitor intermittently  Anemia of chronic  disease - From chronic illnesses.  Hemoglobin stable.  Monitor intermittently  Hyponatremia - Mild.  No labs today.  Monitor intermittently  Left humerus fracture - Secondary to recent fall at home.  Seen by orthopedics recently: Recommended conservative management.  Continue with sling.  Fall precautions.   DVT prophylaxis: Lovenox  Code Status: DNR Family Communication: Wife and daughter at bedside Disposition Plan: Status is: Inpatient Remains inpatient appropriate because: Of severity of illness.  Currently medically stable for discharge to SNF    Consultants: Palliative care  Procedures: None  Antimicrobials:  Anti-infectives (From admission, onward)    Start     Dose/Rate Route Frequency Ordered Stop   09/11/24 2200  amoxicillin -clavulanate (AUGMENTIN ) 875-125 MG per tablet 1 tablet        1 tablet Oral Every 12 hours 09/11/24 1102     09/09/24 1800  azithromycin  (ZITHROMAX ) tablet 500 mg        500 mg Oral Daily 09/08/24 2044 09/13/24 0925   09/09/24 0600  cefTRIAXone  (ROCEPHIN ) 2 g in sodium chloride  0.9 % 100 mL IVPB  Status:  Discontinued        2 g 200 mL/hr over 30 Minutes Intravenous Every 24 hours 09/08/24 2044 09/11/24 1102   09/08/24 1900  azithromycin  (ZITHROMAX ) 500 mg in sodium chloride  0.9 % 250 mL IVPB        500 mg 250 mL/hr over 60 Minutes Intravenous  Once 09/08/24 1849 09/08/24 2102   09/08/24 1700  ceFEPIme  (MAXIPIME ) 2 g in sodium chloride  0.9 % 100 mL IVPB        2 g 200 mL/hr over 30 Minutes Intravenous  Once 09/08/24 1655 09/08/24 1801        Subjective: Patient seen and examined at  bedside.  Poor historian.  No agitation, seizures or vomiting reported.   Objective: Vitals:   09/13/24 0447 09/13/24 1209 09/13/24 2110 09/14/24 0452  BP: 137/69 135/71 131/73 (!) 149/72  Pulse: 71 86 83 81  Resp: 18  18 17   Temp: 98.8 F (37.1 C) 98.9 F (37.2 C) 98.6 F (37 C) 98.3 F (36.8 C)  TempSrc: Oral  Oral   SpO2: 97% 96% 95% 94%  Weight:       Height:        Intake/Output Summary (Last 24 hours) at 09/14/2024 0809 Last data filed at 09/14/2024 0707 Gross per 24 hour  Intake --  Output 100 ml  Net -100 ml   Filed Weights   09/08/24 1621  Weight: 67.5 kg    Examination:  General: No acute distress.  Remains on room air. ENT/neck: No JVD elevation or palpable neck masses noted  respiratory: Bilateral decreased breath sounds at bases with scattered crackles CVS: Rate currently controlled; S1 and S2 are heard.   Abdominal: Soft, nontender, distended and mildly symptomatic no organomegaly, bowel sounds are heard normally Genitourinary: Foley catheter is present  extremities: No clubbing; mild lower extremity edema present CNS: Still confused and a very poor historian.  No focal neurologic deficit.  Able to move extremities Lymph: No obvious palpable lymphadenopathy Skin: No obvious petechiae/rashes  psych: Not agitated currently.  Flat affect mostly  musculoskeletal: No obvious joint tenderness/erythema  Data Reviewed: I have personally reviewed following labs and imaging studies  CBC: Recent Labs  Lab 09/08/24 1638 09/09/24 0048 09/10/24 0917 09/11/24 0658  WBC 14.9* 16.1* 9.4 7.6  NEUTROABS 13.0*  --   --   --   HGB 11.4* 11.7* 10.9* 10.5*  HCT 35.6* 35.0* 33.1* 31.8*  MCV 92.2 90.9 92.5 92.7  PLT 301 349 310 341   Basic Metabolic Panel: Recent Labs  Lab 09/08/24 1638 09/09/24 0048 09/10/24 0550 09/11/24 0658  NA 135 136 137 134*  K 5.3* 4.8 4.7 4.6  CL 99 102 102 101  CO2 24 20* 27 20*  GLUCOSE 292* 253* 191* 213*  BUN 31* 27* 24* 17  CREATININE 1.44* 1.31* 1.32* 1.18  CALCIUM  9.1 9.1 8.7* 8.6*  MG  --   --  2.3  --    GFR: Estimated Creatinine Clearance: 40.6 mL/min (by C-G formula based on SCr of 1.18 mg/dL). Liver Function Tests: Recent Labs  Lab 09/08/24 1638  AST 14*  ALT 6  ALKPHOS 55  BILITOT 0.3  PROT 6.4*  ALBUMIN 3.7   No results for input(s): LIPASE, AMYLASE in the  last 168 hours. No results for input(s): AMMONIA in the last 168 hours. Coagulation Profile: Recent Labs  Lab 09/08/24 1638  INR 1.0   Cardiac Enzymes: No results for input(s): CKTOTAL, CKMB, CKMBINDEX, TROPONINI in the last 168 hours. BNP (last 3 results) Recent Labs    06/28/24 1619  PROBNP 44.0   HbA1C: No results for input(s): HGBA1C in the last 72 hours. CBG: Recent Labs  Lab 09/13/24 0735 09/13/24 1125 09/13/24 1608 09/13/24 2126 09/14/24 0753  GLUCAP 241* 206* 122* 258* 236*   Lipid Profile: No results for input(s): CHOL, HDL, LDLCALC, TRIG, CHOLHDL, LDLDIRECT in the last 72 hours. Thyroid  Function Tests: No results for input(s): TSH, T4TOTAL, FREET4, T3FREE, THYROIDAB in the last 72 hours. Anemia Panel: No results for input(s): VITAMINB12, FOLATE, FERRITIN, TIBC, IRON, RETICCTPCT in the last 72 hours. Sepsis Labs: Recent Labs  Lab 09/08/24 1640 09/08/24 1859 09/09/24  0048  PROCALCITON  --   --  0.19  LATICACIDVEN 3.3* 2.9*  --     Recent Results (from the past 240 hours)  Culture, blood (Routine x 2)     Status: None   Collection Time: 09/08/24  4:29 PM   Specimen: BLOOD  Result Value Ref Range Status   Specimen Description   Final    BLOOD RIGHT ANTECUBITAL Performed at [facility], [address] [place], [place] [address]    Special Requests   Final    BOTTLES DRAWN AEROBIC AND ANAEROBIC Blood Culture results may not be optimal due to an inadequate volume of blood received in culture bottles Performed at [facility], [address] [place], [place] [address]    Culture   Final    NO GROWTH 5 DAYS Performed at [facility], [address] [place], [place] [address]    Report Status 09/13/2024 FINAL  Final  Resp panel by RT-PCR (RSV, Flu A&B, Covid) Anterior Nasal Swab     Status: None   Collection Time: 09/08/24  6:26 PM   Specimen: Anterior Nasal  Swab  Result Value Ref Range Status   SARS Coronavirus 2 by RT PCR NEGATIVE NEGATIVE Final    Comment: (NOTE) SARS-CoV-2 target nucleic acids are NOT DETECTED.  The SARS-CoV-2 RNA is generally detectable in upper respiratory specimens during the acute phase of infection. The lowest concentration of SARS-CoV-2 viral copies this assay can detect is 138 copies/mL. A negative result does not preclude SARS-Cov-2 infection and should not be used as the sole basis for treatment or other patient management decisions. A negative result may occur with  improper specimen collection/handling, submission of specimen other than nasopharyngeal swab, presence of viral mutation(s) within the areas targeted by this assay, and inadequate number of viral copies(<138 copies/mL). A negative result must be combined with clinical observations, patient history, and epidemiological information. The expected result is Negative.  Fact Sheet for Patients:  bloggercourse.com  Fact Sheet for Healthcare Providers:  seriousbroker.it  This test is no t yet approved or cleared by the [place]  FDA and  has been authorized for detection and/or diagnosis of SARS-CoV-2 by FDA under an Emergency Use Authorization (EUA). This EUA will remain  in effect (meaning this test can be used) for the duration of the COVID-19 declaration under Section 564(b)(1) of the Act, 21 U.S.C.section 360bbb-3(b)(1), unless the authorization is terminated  or revoked sooner.       Influenza A by PCR NEGATIVE NEGATIVE Final   Influenza B by PCR NEGATIVE NEGATIVE Final    Comment: (NOTE) The Xpert Xpress SARS-CoV-2/FLU/RSV plus assay is intended as an aid in the diagnosis of influenza from Nasopharyngeal swab specimens and should not be used as a sole basis for treatment. Nasal washings and aspirates are unacceptable for Xpert Xpress SARS-CoV-2/FLU/RSV testing.  Fact Sheet for  Patients: bloggercourse.com  Fact Sheet for Healthcare Providers: seriousbroker.it  This test is not yet approved or cleared by the [place]  FDA and has been authorized for detection and/or diagnosis of SARS-CoV-2 by FDA under an Emergency Use Authorization (EUA). This EUA will remain in effect (meaning this test can be used) for the duration of the COVID-19 declaration under Section 564(b)(1) of the Act, 21 U.S.C. section 360bbb-3(b)(1), unless the authorization is terminated or revoked.     Resp Syncytial Virus by PCR NEGATIVE NEGATIVE Final    Comment: (NOTE) Fact Sheet for Patients: bloggercourse.com  Fact Sheet  for Healthcare Providers: seriousbroker.it  This test is not yet approved or cleared by the [place]  FDA and has been authorized for detection and/or diagnosis of SARS-CoV-2 by FDA under an Emergency Use Authorization (EUA). This EUA will remain in effect (meaning this test can be used) for the duration of the COVID-19 declaration under Section 564(b)(1) of the Act, 21 U.S.C. section 360bbb-3(b)(1), unless the authorization is terminated or revoked.  Performed at [facility], [address], [place], [place] [address]   Culture, blood (Routine x 2)     Status: None (Preliminary result)   Collection Time: 09/09/24 12:48 AM   Specimen: BLOOD RIGHT ARM  Result Value Ref Range Status   Specimen Description   Final    BLOOD RIGHT ARM Performed at [facility], [address], [place], [place] [address]    Special Requests   Final    BOTTLES DRAWN AEROBIC AND ANAEROBIC Blood Culture results may not be optimal due to an inadequate volume of blood received in culture bottles Performed at [facility], [address], [place], [place] [address]    Culture   Final    NO GROWTH 4 DAYS Performed at [facility], [address], [place], [place] [address]    Report Status PENDING  Incomplete         Radiology Studies: No results found.      Scheduled Meds:  acetaminophen   650 mg Rectal Once   amoxicillin -clavulanate  1 tablet Oral Q12H   Chlorhexidine  Gluconate Cloth  6 each Topical Daily   cholecalciferol   2,000 Units Oral Daily   clonazePAM   1 mg Oral QHS   vitamin B-12  2,000 mcg Oral Daily   cycloSPORINE   1 drop Both Eyes BID   docusate sodium   100 mg Oral BID   DULoxetine   60 mg Oral Daily   enoxaparin  (LOVENOX ) injection  40 mg Subcutaneous Q24H   famotidine   20 mg Oral BID   gabapentin   300 mg Oral TID   insulin  aspart  0-15 Units Subcutaneous TID WC   insulin  aspart  0-5 Units Subcutaneous QHS   linagliptin   5 mg Oral Daily   metFORMIN   1,000 mg Oral BID WC   phenazopyridine   100 mg Oral TID WC   polyethylene glycol  17 g Oral Daily   tamsulosin   0.4 mg Oral Daily   zolpidem   5 mg Oral QHS   Continuous Infusions:        Sophie Mao, MD [facility] 09/14/2024, 8:09 AM

## 2024-09-14 NOTE — Consult Note (Signed)
 Consultation Note Date: 09/14/2024   Patient Name: Jeffery Stewart  DOB: Apr 10, 1936  MRN: [id number]  Age / Sex: 88 y.o., male  PCP: Nedra Tinnie LABOR, NP Referring Physician: Cheryle Page, MD  Reason for Consultation: Establishing goals of care  HPI/Patient Profile: 88 y.o. male  with past medical history of advanced dementia, type 2 diabetes, neuropathy, postpolio syndrome, fall with L humerus fracture 2 days ago (sent home with sling) admitted on 09/08/2024 with lethargy, low grade fever, chronic constipation with likely RML pneumonia. Per H&P treatment for pneumonia ongoing along with priority for comfort and symptom management as well.   Clinical Assessment and Goals of Care: Consult received and thorough chart review completed. I met today at [name]'s bedside along with wife Avelina and daughter Ronal. Family preferred to speak outside of room. [name] is awake and smiles giving me a happy greeting when I enter the room. Family report that he is very pleasantly confused and has always had a very easy and happy demeanor. He has been declining at home especially since his fall. They have noted ongoing decline with his progressing dementia as well as postpolio syndrome. Reviewed risk of falls and aspiration with dementia. We discussed risks vs benefits of SNF rehab vs home. Family confirm that SNF rehab is not an option if they cannot stay with him 24/7 knowing he is impulsive and risk of fall. They would rather him be at home. Daughter Ronal lives right next door and is very helpful caring for him at home. They do have some support from sitters that they are happy with and are working with [facility] to increase support at [facility].   Family voice desire for improved pain control. They agree that morphine  has been tolerated and helpful but they are hoping we can stay ahead of the pain better instead of being reactive  after he is miserable. We discussed alternating Tylenol  and NSAID with ongoing PRN morphine . Wife reports that hydroxyzine  is also helpful for him too.   We reviewed help at home and discussed hospice. They both have had experience with hospice in the past and understand what this entails. We discussed that having hospice means we have to believe that prognosis may be less than 6 months. We discussed hospice philosophy to help keep him happy and comfortable and avoid rehospitalization. Both wife and daughter indicate that hospice could be a good support for them. However, they also believe that his 2 sons should be involved in decision making as well. They will try and coordinate a time for us  to all speak together about path forward.   All questions/concerns addressed. Emotional support provided.   Primary Decision Maker NEXT OF KIN wife Avelina    SUMMARY OF RECOMMENDATIONS   - DNR - Considering home with hospice - Ongoing palliative conversations   Code Status/Advance Care Planning: DNR   Symptom Management:  Pain secondary to L humerus fracture: Tylenol  1000 mg po q8h alternating with Toradol  15 mg IV q8h. Continue IV morphine  but may provide  SL roxanol instead.   Prognosis:  Overall prognosis poor with advancing dementia.   Discharge Planning: Considering hospice at home.      Primary Diagnoses: Present on Admission:  Sepsis due to pneumonia (HCC)  Dementia (HCC)  Type 2 diabetes mellitus with diabetic neuropathy, unspecified (HCC)  Chronic midline low back pain without sciatica  Other insomnia  Urinary urgency  Closed fracture of head of humerus, left, initial encounter   I have reviewed the medical record, interviewed the patient and family, and examined the patient. The following aspects are pertinent.  Past Medical History:  Diagnosis Date   Arthritis    Dementia (HCC) 2012   Diabetes mellitus without complication (HCC)    GERD (gastroesophageal reflux  disease)    Glaucoma    History of prostate cancer    Neuropathy    Post-polio syndrome (HCC)    possible weakned intercostal muscles, fatigues easily   Social History   Socioeconomic History   Marital status: Married    Spouse name: Not on file   Number of children: Not on file   Years of education: Not on file   Highest education level: Not on file  Occupational History   Not on file  Tobacco Use   Smoking status: Never   Smokeless tobacco: Never  Substance and Sexual Activity   Alcohol use: Yes    Comment: occasional   Drug use: Never   Sexual activity: Not on file  Other Topics Concern   Not on file  Social History Narrative   Not on file   Social Drivers of Health   Tobacco Use: Low Risk (09/08/2024)   Patient History    Smoking Tobacco Use: Never    Smokeless Tobacco Use: Never    Passive Exposure: Not on file  Financial Resource Strain: Not on file  Food Insecurity: No Food Insecurity (09/08/2024)   Epic    Worried About Programme Researcher, Broadcasting/film/video in the Last Year: Never true    Ran Out of Food in the Last Year: Never true  Transportation Needs: No Transportation Needs (09/08/2024)   Epic    Lack of Transportation (Medical): No    Lack of Transportation (Non-Medical): No  Physical Activity: Not on file  Stress: Not on file  Social Connections: Unknown (09/08/2024)   Social Connection and Isolation Panel    Frequency of Communication with Friends and Family: Patient unable to answer    Frequency of Social Gatherings with Friends and Family: Patient unable to answer    Attends Religious Services: Patient unable to answer    Active Member of Clubs or Organizations: Patient unable to answer    Attends Banker Meetings: Patient unable to answer    Marital Status: Married  Depression (PHQ2-9): High Risk (04/05/2024)   Depression (PHQ2-9)    PHQ-2 Score: 26  Alcohol Screen: Not on file  Housing: Unknown (09/08/2024)   Epic    Unable to Pay for Housing  in the Last Year: No    Number of Times Moved in the Last Year: 0    Homeless in the Last Year: Patient unable to answer  Utilities: Not At Risk (09/08/2024)   Epic    Threatened with loss of utilities: No  Health Literacy: Not on file   Family History  Problem Relation Age of Onset   CAD Brother    CAD Daughter 52   Scheduled Meds:  acetaminophen   650 mg Rectal Once   amoxicillin -clavulanate  1 tablet Oral  Q12H   Chlorhexidine  Gluconate Cloth  6 each Topical Daily   cholecalciferol   2,000 Units Oral Daily   clonazePAM   1 mg Oral QHS   vitamin B-12  2,000 mcg Oral Daily   cycloSPORINE   1 drop Both Eyes BID   docusate sodium   100 mg Oral BID   DULoxetine   60 mg Oral Daily   enoxaparin  (LOVENOX ) injection  40 mg Subcutaneous Q24H   famotidine   20 mg Oral BID   gabapentin   300 mg Oral TID   insulin  aspart  0-15 Units Subcutaneous TID WC   insulin  aspart  0-5 Units Subcutaneous QHS   linagliptin   5 mg Oral Daily   metFORMIN   1,000 mg Oral BID WC   phenazopyridine   100 mg Oral TID WC   polyethylene glycol  17 g Oral Daily   tamsulosin   0.4 mg Oral Daily   zolpidem   5 mg Oral QHS   Continuous Infusions: PRN Meds:.acetaminophen  **OR** acetaminophen , bisacodyl , hydrOXYzine , lidocaine , morphine  injection, nitroGLYCERIN , ondansetron  **OR** ondansetron  (ZOFRAN ) IV, oxyCODONE  Allergies[1] Review of Systems  Unable to perform ROS: Dementia    Physical Exam Vitals and nursing note reviewed.  Constitutional:      General: He is awake. He is not in acute distress.    Appearance: He is ill-appearing.  Cardiovascular:     Rate and Rhythm: Normal rate.  Pulmonary:     Effort: No tachypnea, accessory muscle usage or respiratory distress.  Abdominal:     General: Abdomen is flat.  Neurological:     Mental Status: He is confused.     Vital Signs: BP (!) 149/72 (BP Location: Right Arm)   Pulse 81   Temp 98.3 F (36.8 C)   Resp 17   Ht 5' 6 (1.676 m)   Wt 67.5 kg   SpO2 94%    BMI 24.02 kg/m  Pain Scale: Faces POSS *See Group Information*: S-Acceptable,Sleep, easy to arouse Pain Score: 0-No pain   SpO2: SpO2: 94 % O2 Device:SpO2: 94 % O2 Flow Rate: .   IO: Intake/output summary:  Intake/Output Summary (Last 24 hours) at 09/14/2024 1235 Last data filed at 09/14/2024 1218 Gross per 24 hour  Intake 100 ml  Output 650 ml  Net -550 ml    LBM: Last BM Date : 09/14/24 Baseline Weight: Weight: 67.5 kg Most recent weight: Weight: 67.5 kg     Palliative Assessment/Data:    Time Total: 75 min  Greater than 50%  of this time was spent counseling and coordinating care related to the above assessment and plan.  Signed by: Bernarda Kitty, NP Palliative Medicine Team Pager # [phone] (M-F 8a-5p) Team Phone # [phone] (Nights/Weekends)                     [1]  Allergies Allergen Reactions   Garlic Nausea Only and Other (See Comments)    Face and body get hot- for a few days afterwards. The patient gets sick. He CANNOT have garlic- in ANY form.

## 2024-09-14 NOTE — Progress Notes (Addendum)
 Occupational Therapy Treatment Patient Details Name: Jeffery Stewart MRN: [id number] DOB: 11-Oct-1935 Today's Date: 09/14/2024   History of present illness Jeffery Stewart is a 88 y.o. male who lives at home with his wife.  He had a mechanical fall 2 days ago and suffered left humerus fracture.  Was seen in the emergency room and sent home with sling.  Patient went to see orthopedics yesterday, was advised conservative management. PMH: advanced dementia, type 2 diabetes, neuropathy, postpolio syndrome   OT comments  Patient seen for skilled OT session with wife present bedside and participating in care and education. Poor tolerance exhibited for all mobility despite premedicated and L UE in good alignment and repositioned well in ortho MD issued sling. OT deferring alternate sling family brought in unless ordered by MD due to complexity of presented sling device vs simplicity of current. Furthermore, based on need for patient to be in sitting position to don and need for 2 person assist to maintain balance and poor tolerance to prolonged maneuvering, OT deferring decision to MD if family wanted to pursue request further. Wife also reports they are no longer wanting rehab and now considering home with hospice services. OT recommending hospital bed, w/c and cushion, Hoyer lift and BSC if home. Educated in skin protection and patient place in sidelying post session with pillows to offload pressure at end of session with NT. Written handout with photos provided for current sling education for carryover. Reported sling assessment and OT clinical reasoning outcome to nursing as well.   Patient requires continued Acute care hospital level OT services to progress safety and functional performance and allow for discharge.        If plan is discharge home, recommend the following:  Two people to help with walking and/or transfers;A lot of help with bathing/dressing/bathroom;Assistance with cooking/housework;Assistance  with feeding;Direct supervision/assist for medications management;Direct supervision/assist for financial management;Assist for transportation;Help with stairs or ramp for entrance;Supervision due to cognitive status   Equipment Recommendations  Wheelchair (measurements OT);Wheelchair cushion (measurements OT);Hospital bed;Hoyer lift;BSC/3in1 (if home with Hospice)       Precautions / Restrictions Precautions Precautions: Fall Recall of Precautions/Restrictions: Impaired Precaution/Restrictions Comments: NWB L shoulder in sling Required Braces or Orthoses: Sling Restrictions Weight Bearing Restrictions Per Provider Order: No LUE Weight Bearing Per Provider Order: Non weight bearing Other Position/Activity Restrictions: no formal weight bearing precautions noted for L humerus fracture, being treated conservatively; PT/OT at eval treated as NWB for safety and pain management       Mobility Bed Mobility Overal bed mobility: Needs Assistance Bed Mobility: Supine to Sit, Sit to Supine     Supine to sit: +2 for physical assistance, +2 for safety/equipment, HOB elevated, Used rails, Max assist Sit to supine: Max assist, +2 for physical assistance, +2 for safety/equipment, HOB elevated, Used rails   General bed mobility comments: heavy use of bed features. Max multimodal cueing required. Pt able to use bedrail with cues, poor tolerance EOB more from scrotal and buttocks ppain than L UE as sling in proper position    Transfers Overall transfer level:  (remained EOB and returned to supine due to poor tolerance)                       Balance Overall balance assessment: Needs assistance Sitting-balance support: Feet supported, Single extremity supported Sitting balance-Leahy Scale: Poor   Postural control: Posterior lean  ADL either performed or assessed with clinical judgement   ADL Overall ADL's : Needs  assistance/impaired Eating/Feeding: Moderate assistance;Maximal assistance;Sitting Eating/Feeding Details (indicate cue type and reason): on Dys 1 diet, one hand use on R side only Grooming: Wash/dry hands;Wash/dry face;Maximal assistance;Sitting;Cueing for UE precautions;Cueing for safety;Cueing for sequencing Grooming Details (indicate cue type and reason): pain EOB limited task         Upper Body Dressing : Sitting;Maximal assistance;Cueing for UE precautions;Cueing for sequencing;Cueing for safety;Cueing for compensatory techniques Upper Body Dressing Details (indicate cue type and reason): including sling mngt                 Functional mobility during ADLs: Moderate assistance;Maximal assistance;+2 for physical assistance;+2 for safety/equipment General ADL Comments: significant pain coming to sit EOB limiting tolerance despite premedicated, sling in place with pillow prop    Extremity/Trunk Assessment Upper Extremity Assessment Upper Extremity Assessment: Right hand dominant;Difficult to assess due to impaired cognition;LUE deficits/detail LUE Deficits / Details: L humerus fx, shoulder sling LUE: Shoulder pain at rest;Unable to fully assess due to immobilization LUE Coordination: decreased fine motor;decreased gross motor   Lower Extremity Assessment Lower Extremity Assessment: Defer to PT evaluation        Vision   Vision Assessment?: No apparent visual deficits   Perception Perception Perception: Not tested   Praxis Praxis Praxis: Impaired   Communication Communication Communication: Impaired Factors Affecting Communication: Hearing impaired   Cognition Arousal: Alert Behavior During Therapy: WFL for tasks assessed/performed Cognition: History of cognitive impairments             OT - Cognition Comments: awake, alert, Ox 1, cues for sustained attention, redirection from fidgiting with lines and IV, poor safety, judgement, limited processing                  Following commands: Impaired Following commands impaired: Follows one step commands inconsistently      Cueing   Cueing Techniques: Verbal cues, Gestural cues, Tactile cues, Visual cues        General Comments poor tolerance due to buttocks, scrotal and R shoulder pain limited all task tolerance despite pain meds given, repositioned in sidelying to R side with pillow offloading    Pertinent Vitals/ Pain       Pain Assessment Pain Assessment: Faces Faces Pain Scale: Hurts whole lot Breathing: occasional labored breathing, short period of hyperventilation Negative Vocalization: repeated troubled calling out, loud moaning/groaning, crying Facial Expression: facial grimacing Body Language: tense, distressed pacing, fidgeting Consolability: distracted or reassured by voice/touch PAINAD Score: 7 Pain Location: L shoulder, scrotum Pain Descriptors / Indicators: Grimacing, Guarding, Moaning Pain Intervention(s): Limited activity within patient's tolerance, Monitored during session, Premedicated before session, Repositioned, Relaxation, Other (comment) (placed in sidelying post session and readjusted sling)   Frequency  Min 2X/week        Progress Toward Goals  OT Goals(current goals can now be found in the care plan section)  Progress towards OT goals: Not progressing toward goals - comment  Acute Rehab OT Goals Patient Stated Goal: to go home where its quiet OT Goal Formulation: With patient/family Time For Goal Achievement: 09/24/24 Potential to Achieve Goals: Fair ADL Goals Pt Will Perform Eating: with supervision;sitting Pt Will Perform Grooming: with min assist;sitting Pt Will Perform Upper Body Bathing: with mod assist;sitting Pt Will Perform Upper Body Dressing: with max assist;sitting;with caregiver independent in assisting Pt Will Transfer to Toilet: with min assist;with +2 assist;stand pivot transfer;bedside commode  Plan  AM-PAC OT 6 Clicks  Daily Activity     Outcome Measure   Help from another person eating meals?: A Lot Help from another person taking care of personal grooming?: A Lot Help from another person toileting, which includes using toliet, bedpan, or urinal?: Total Help from another person bathing (including washing, rinsing, drying)?: Total Help from another person to put on and taking off regular upper body clothing?: Total Help from another person to put on and taking off regular lower body clothing?: Total 6 Click Score: 8    End of Session Equipment Utilized During Treatment: Gait belt;Other (comment) (sling)  OT Visit Diagnosis: Unsteadiness on feet (R26.81);Other abnormalities of gait and mobility (R26.89);History of falling (Z91.81);Feeding difficulties (R63.3);Cognitive communication deficit (R41.841);Pain Pain - Right/Left: Left Pain - part of body: Shoulder (scrotum, buttocks)   Activity Tolerance Patient limited by fatigue;Patient limited by pain   Patient Left in bed;with call bell/phone within reach;with bed alarm set;with family/visitor present;with nursing/sitter in room   Nurse Communication Mobility status;Precautions;Weight bearing status;Other (comment) (OT not changing out sling due to complexity and intolerance to maneuvering needed due to pain)        Time: 1550-1620 OT Time Calculation (min): 30 min  Charges: OT General Charges $OT Visit: 1 Visit OT Treatments $Self Care/Home Management : 8-22 mins $Therapeutic Activity: 8-22 mins  Harol Shabazz OT/L Acute Rehabilitation Department  [phone]  09/14/2024, 5:38 PM

## 2024-09-15 DIAGNOSIS — A419 Sepsis, unspecified organism: Secondary | ICD-10-CM | POA: Diagnosis not present

## 2024-09-15 DIAGNOSIS — J189 Pneumonia, unspecified organism: Secondary | ICD-10-CM | POA: Diagnosis not present

## 2024-09-15 LAB — GLUCOSE, CAPILLARY
Glucose-Capillary: 229 mg/dL — ABNORMAL HIGH (ref 70–99)
Glucose-Capillary: 225 mg/dL — ABNORMAL HIGH (ref 70–99)
Glucose-Capillary: 69 mg/dL — ABNORMAL LOW (ref 70–99)
Glucose-Capillary: 128 mg/dL — ABNORMAL HIGH (ref 70–99)
Glucose-Capillary: 162 mg/dL — ABNORMAL HIGH (ref 70–99)

## 2024-09-15 MED ORDER — GABAPENTIN 300 MG PO CAPS
600.0000 mg | ORAL_CAPSULE | Freq: Three times a day (TID) | ORAL | Status: DC
  Administered 2024-09-15 – 2024-09-16 (×6): 600 mg via ORAL
  Filled 2024-09-15 (×6): qty 2

## 2024-09-15 MED ORDER — SENNA 8.6 MG PO TABS
1.0000 | ORAL_TABLET | Freq: Every day | ORAL | Status: DC
  Administered 2024-09-15: 11:00:00 8.6 mg via ORAL
  Filled 2024-09-15: qty 1

## 2024-09-15 NOTE — Progress Notes (Signed)
° °      Overnight   NAME: Jeffery Stewart MRN: [id number] DOB : 1936-04-28    Date of Service   09/15/2024   HPI/Events of Note    Notified by RN of patient family reported potential  15 sec seizure. Unwitnessed by staff. Patient stable on RN assessment.  Potential Hospice discharge today.       Interventions/ Plan   Continue Attending orders.       Lynwood Kipper BSN MSNA MSN ACNPC-AG Acute Care Nurse Practitioner [facility]

## 2024-09-15 NOTE — Progress Notes (Signed)
 Palliative:  HPI: 88 y.o. 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 Required intubation 8/12.   I met today with 3 daughters, sister, brother, and Jeffery Lecher NP PCCM. We had discussion regarding path forward for one way extubation. We reviewed poor prognosis and expectation that Jeffery Stewart will not do well once extubated. Family reiterate goal that he not suffer. We reviewed option to have medication available if he is having distress vs having medication already onboard to prevent distress. Family would like to have medication onboard to minimize any suffering during transition off ventilator. They would like to move forward with extubation later today and will have the rest of the family come to visit prior to extubation.   I reviewed plans and symptom management recommendations with RN.   Update: I was present with family prior to additional support as well as throughout extubation. Family appropriately tearful. Assisted to ensure comfort. I left family to visit privately with Jeffery Stewart after he is resting comfortably after extubation.   All questions/concerns addressed. Emotional support provided. Much time coordinating care with RN, PCCM, and family.   Exam: Sedated on vent. FiO2 50%. Breathing regular, unlabored on vent. No distress. Not following commands. Abd soft. Warm to touch.   Plan:  - DNR - Extubated to full comfort care - Anticipate hospital death  100 min  Bernarda Kitty, NP Palliative Medicine Team Pager [phone] (Please see [email or web address] for schedule) Team Phone [phone]

## 2024-09-15 NOTE — Progress Notes (Signed)
 PROGRESS NOTE    Jeffery Stewart  FMW:[id number] DOB: [birth year] DOA: 09/08/2024 PCP: Nedra Tinnie LABOR, NP   Brief Narrative:  88 y.o. male with medical history significant of advanced dementia, type 2 diabetes, neuropathy, postpolio syndrome, recent fall at home resulting in left humerus fracture for which he was placed in a sling and sent home from the ED with subsequent evaluation by orthopedics in the office recommended conservative management.  He presented with increasing lethargy and low-grade fever and was admitted for pneumonia and started on IV antibiotics.  PT recommended SNF placement.  Palliative care consulted as well.  Assessment & Plan:   Sepsis: Present on admission Possible aspiration pneumonia: Present on admission - Initially treated with Rocephin  and Zithromax  but has already been switched to Augmentin  and Zithromax .  Patient has received 7-day course of antibiotics.  DC antibiotics. -Diet as per SLP recommendations. - Currently hemodynamically stable with no temperature spikes.  Blood cultures negative so far.  Sepsis has resolved.  On room air. - On presentation, CT of the abdomen and pelvis did not show any acute findings except for constipation  History of advanced dementia with behavioral disturbance/acute metabolic encephalopathy Goals of care - Mental status currently stable and back to baseline.  Monitor mental status.  Fall precautions/delirium precautions.  CT of the head did not show any acute findings on presentation - Continue duloxetine  - Palliative care following.  Family leaning towards home hospice.  Follow further discussions with family palliative care team.  TOC has been consulted.  Acute urinary retention - Patient failed voiding trial on 09/13/2024 and Foley catheter had to be placed again.  Continue Flomax   Diabetes mellitus type 2 with hyperglycemia -continue CBGs with SSI along with linagliptin  and metformin .  Carb modified  diet  Leukocytosis - Resolved  CKD stage IIIb - Creatinine currently at baseline.  Monitor intermittently  Anemia of chronic disease - From chronic illnesses.  Hemoglobin stable.  Monitor intermittently  Hyponatremia - Mild.  No labs today.  Monitor intermittently  Left humerus fracture - Secondary to recent fall at home.  Seen by orthopedics recently: Recommended conservative management.  Continue with sling.  Fall precautions.  Physical deconditioning - PT recommending SNF placement.  TOC following.  Family now leaning towards home hospice.  DVT prophylaxis: Lovenox  Code Status: DNR Family Communication: Wife and daughter at bedside Disposition Plan: Status is: Inpatient Remains inpatient appropriate because: Of severity of illness.  Awaiting safe disposition: SNF versus home hospice   Consultants: Palliative care  Procedures: None  Antimicrobials:  Anti-infectives (From admission, onward)    Start     Dose/Rate Route Frequency Ordered Stop   09/11/24 2200  amoxicillin -clavulanate (AUGMENTIN ) 875-125 MG per tablet 1 tablet        1 tablet Oral Every 12 hours 09/11/24 1102     09/09/24 1800  azithromycin  (ZITHROMAX ) tablet 500 mg        500 mg Oral Daily 09/08/24 2044 09/13/24 0925   09/09/24 0600  cefTRIAXone  (ROCEPHIN ) 2 g in sodium chloride  0.9 % 100 mL IVPB  Status:  Discontinued        2 g 200 mL/hr over 30 Minutes Intravenous Every 24 hours 09/08/24 2044 09/11/24 1102   09/08/24 1900  azithromycin  (ZITHROMAX ) 500 mg in sodium chloride  0.9 % 250 mL IVPB        500 mg 250 mL/hr over 60 Minutes Intravenous  Once 09/08/24 1849 09/08/24 2102   09/08/24 1700  ceFEPIme  (MAXIPIME ) 2 g in sodium  chloride 0.9 % 100 mL IVPB        2 g 200 mL/hr over 30 Minutes Intravenous  Once 09/08/24 1655 09/08/24 1801        Subjective: Patient seen and examined at bedside.  Poor historian.  No fever, vomiting, abdominal pain reported.  Apparently had a brief seizure episode  this morning while sleeping.   Objective: Vitals:   09/13/24 2110 09/14/24 0452 09/14/24 2017 09/15/24 0449  BP: 131/73 (!) 149/72 122/67 120/68  Pulse: 83 81 80 77  Resp: 18 17 18 18   Temp: 98.6 F (37 C) 98.3 F (36.8 C) 98.6 F (37 C) (!) 97.4 F (36.3 C)  TempSrc: Oral  Oral   SpO2: 95% 94% 94% 94%  Weight:      Height:        Intake/Output Summary (Last 24 hours) at 09/15/2024 0809 Last data filed at 09/14/2024 1632 Gross per 24 hour  Intake 100 ml  Output 675 ml  Net -575 ml   Filed Weights   09/08/24 1621  Weight: 67.5 kg    Examination:  General: Currently on room air and in no distress ENT/neck: No palpable thyromegaly or elevated JVD noted respiratory: Decreased breath sounds at bases bilaterally with some crackles CVS: S1-S2 heard; currently rate controlled Abdominal: Soft, nontender, remains slightly distended; no organomegaly, bowel sounds are heard  genitourinary: Has a Foley catheter extremities: Trace lower extremity edema present; no cyanosis CNS: Awake but confused; extremely poor historian.  No obvious focal deficits noted  lymph: No lymphadenopathy palpable Skin: No obvious ecchymosis/lesions psych: Extremely flat affect with no signs of agitation currently  musculoskeletal: No obvious joint swelling/deformity  Data Reviewed: I have personally reviewed following labs and imaging studies  CBC: Recent Labs  Lab 09/08/24 1638 09/09/24 0048 09/10/24 0917 09/11/24 0658  WBC 14.9* 16.1* 9.4 7.6  NEUTROABS 13.0*  --   --   --   HGB 11.4* 11.7* 10.9* 10.5*  HCT 35.6* 35.0* 33.1* 31.8*  MCV 92.2 90.9 92.5 92.7  PLT 301 349 310 341   Basic Metabolic Panel: Recent Labs  Lab 09/08/24 1638 09/09/24 0048 09/10/24 0550 09/11/24 0658  NA 135 136 137 134*  K 5.3* 4.8 4.7 4.6  CL 99 102 102 101  CO2 24 20* 27 20*  GLUCOSE 292* 253* 191* 213*  BUN 31* 27* 24* 17  CREATININE 1.44* 1.31* 1.32* 1.18  CALCIUM  9.1 9.1 8.7* 8.6*  MG  --   --  2.3   --    GFR: Estimated Creatinine Clearance: 40.6 mL/min (by C-G formula based on SCr of 1.18 mg/dL). Liver Function Tests: Recent Labs  Lab 09/08/24 1638  AST 14*  ALT 6  ALKPHOS 55  BILITOT 0.3  PROT 6.4*  ALBUMIN 3.7   No results for input(s): LIPASE, AMYLASE in the last 168 hours. No results for input(s): AMMONIA in the last 168 hours. Coagulation Profile: Recent Labs  Lab 09/08/24 1638  INR 1.0   Cardiac Enzymes: No results for input(s): CKTOTAL, CKMB, CKMBINDEX, TROPONINI in the last 168 hours. BNP (last 3 results) Recent Labs    06/28/24 1619  PROBNP 44.0   HbA1C: No results for input(s): HGBA1C in the last 72 hours. CBG: Recent Labs  Lab 09/14/24 0753 09/14/24 1133 09/14/24 1640 09/14/24 2055 09/15/24 0741  GLUCAP 236* 237* 155* 180* 229*   Lipid Profile: No results for input(s): CHOL, HDL, LDLCALC, TRIG, CHOLHDL, LDLDIRECT in the last 72 hours. Thyroid  Function Tests: No results  for input(s): TSH, T4TOTAL, FREET4, T3FREE, THYROIDAB in the last 72 hours. Anemia Panel: No results for input(s): VITAMINB12, FOLATE, FERRITIN, TIBC, IRON, RETICCTPCT in the last 72 hours. Sepsis Labs: Recent Labs  Lab 09/08/24 1640 09/08/24 1859 09/09/24 0048  PROCALCITON  --   --  0.19  LATICACIDVEN 3.3* 2.9*  --     Recent Results (from the past 240 hours)  Culture, blood (Routine x 2)     Status: None   Collection Time: 09/08/24  4:29 PM   Specimen: BLOOD  Result Value Ref Range Status   Specimen Description   Final    BLOOD RIGHT ANTECUBITAL Performed at [facility], [address] [place], [place] [address]    Special Requests   Final    BOTTLES DRAWN AEROBIC AND ANAEROBIC Blood Culture results may not be optimal due to an inadequate volume of blood received in culture bottles Performed at [facility], [address], [place], [place] [address]    Culture   Final     NO GROWTH 5 DAYS Performed at [facility], [address] [place], [place] [address]    Report Status 09/13/2024 FINAL  Final  Resp panel by RT-PCR (RSV, Flu A&B, Covid) Anterior Nasal Swab     Status: None   Collection Time: 09/08/24  6:26 PM   Specimen: Anterior Nasal Swab  Result Value Ref Range Status   SARS Coronavirus 2 by RT PCR NEGATIVE NEGATIVE Final    Comment: (NOTE) SARS-CoV-2 target nucleic acids are NOT DETECTED.  The SARS-CoV-2 RNA is generally detectable in upper respiratory specimens during the acute phase of infection. The lowest concentration of SARS-CoV-2 viral copies this assay can detect is 138 copies/mL. A negative result does not preclude SARS-Cov-2 infection and should not be used as the sole basis for treatment or other patient management decisions. A negative result may occur with  improper specimen collection/handling, submission of specimen other than nasopharyngeal swab, presence of viral mutation(s) within the areas targeted by this assay, and inadequate number of viral copies(<138 copies/mL). A negative result must be combined with clinical observations, patient history, and epidemiological information. The expected result is Negative.  Fact Sheet for Patients:  bloggercourse.com  Fact Sheet for Healthcare Providers:  seriousbroker.it  This test is no t yet approved or cleared by the [place]  FDA and  has been authorized for detection and/or diagnosis of SARS-CoV-2 by FDA under an Emergency Use Authorization (EUA). This EUA will remain  in effect (meaning this test can be used) for the duration of the COVID-19 declaration under Section 564(b)(1) of the Act, 21 U.S.C.section 360bbb-3(b)(1), unless the authorization is terminated  or revoked sooner.       Influenza A by PCR NEGATIVE NEGATIVE Final   Influenza B by PCR NEGATIVE NEGATIVE Final    Comment: (NOTE) The Xpert Xpress  SARS-CoV-2/FLU/RSV plus assay is intended as an aid in the diagnosis of influenza from Nasopharyngeal swab specimens and should not be used as a sole basis for treatment. Nasal washings and aspirates are unacceptable for Xpert Xpress SARS-CoV-2/FLU/RSV testing.  Fact Sheet for Patients: bloggercourse.com  Fact Sheet for Healthcare Providers: seriousbroker.it  This test is not yet approved or cleared by the [place]  FDA and has been authorized for detection and/or diagnosis of SARS-CoV-2 by FDA under an Emergency Use Authorization (EUA). This EUA will remain in effect (meaning this test can be used) for the duration of the COVID-19 declaration under Section  564(b)(1) of the Act, 21 U.S.C. section 360bbb-3(b)(1), unless the authorization is terminated or revoked.     Resp Syncytial Virus by PCR NEGATIVE NEGATIVE Final    Comment: (NOTE) Fact Sheet for Patients: bloggercourse.com  Fact Sheet for Healthcare Providers: seriousbroker.it  This test is not yet approved or cleared by the [place]  FDA and has been authorized for detection and/or diagnosis of SARS-CoV-2 by FDA under an Emergency Use Authorization (EUA). This EUA will remain in effect (meaning this test can be used) for the duration of the COVID-19 declaration under Section 564(b)(1) of the Act, 21 U.S.C. section 360bbb-3(b)(1), unless the authorization is terminated or revoked.  Performed at [facility], [address], [place], [place] [address]   Culture, blood (Routine x 2)     Status: None   Collection Time: 09/09/24 12:48 AM   Specimen: BLOOD RIGHT ARM  Result Value Ref Range Status   Specimen Description   Final    BLOOD RIGHT ARM Performed at [facility], [address] [place], [place] [address]    Special Requests   Final    BOTTLES DRAWN AEROBIC AND ANAEROBIC Blood  Culture results may not be optimal due to an inadequate volume of blood received in culture bottles Performed at [facility], [address], [place], [place] [address]    Culture   Final    NO GROWTH 5 DAYS Performed at [facility], [address], [place], [place] [address]    Report Status 09/14/2024 FINAL  Final         Radiology Studies: No results found.      Scheduled Meds:  acetaminophen   1,000 mg Oral Q8H   amoxicillin -clavulanate  1 tablet Oral Q12H   Chlorhexidine  Gluconate Cloth  6 each Topical Daily   cholecalciferol   2,000 Units Oral Daily   clonazePAM   1 mg Oral QHS   vitamin B-12  2,000 mcg Oral Daily   cycloSPORINE   1 drop Both Eyes BID   docusate sodium   100 mg Oral BID   DULoxetine   60 mg Oral Daily   enoxaparin  (LOVENOX ) injection  40 mg Subcutaneous Q24H   famotidine   20 mg Oral BID   gabapentin   300 mg Oral TID   insulin  aspart  0-15 Units Subcutaneous TID WC   insulin  aspart  0-5 Units Subcutaneous QHS   ketorolac   15 mg Intravenous Q8H   linagliptin   5 mg Oral Daily   metFORMIN   1,000 mg Oral BID WC   polyethylene glycol  17 g Oral Daily   tamsulosin   0.4 mg Oral Daily   zolpidem   5 mg Oral QHS   Continuous Infusions:        Sophie Mao, MD [facility] 09/15/2024, 8:09 AM

## 2024-09-15 NOTE — Progress Notes (Signed)
 PT Cancellation Note  Patient Details Name: Jeffery Stewart MRN: [id number] DOB: 12-May-1936   Cancelled Treatment:    Reason Eval/Treat Not Completed:  Spoke briefly with family who politely requested we allow pt to rest on today. She reports plan is for home with hospice.    Dannial SQUIBB, PT Acute Rehabilitation  Office: [phone]

## 2024-09-15 NOTE — Progress Notes (Signed)
 daughter witnessed possible tonic/clonic seizure lasting 15 sec at 0500  VSS pt sleeping at present On call aware

## 2024-09-15 NOTE — Plan of Care (Signed)
   Problem: Coping: Goal: Ability to adjust to condition or change in health will improve Outcome: Progressing   Problem: Fluid Volume: Goal: Ability to maintain a balanced intake and output will improve Outcome: Progressing   Problem: Health Behavior/Discharge Planning: Goal: Ability to identify and utilize available resources and services will improve Outcome: Progressing

## 2024-09-15 NOTE — TOC Progression Note (Signed)
 Transition of Care [facility]) - Progression Note    Patient Details  Name: Jeffery Stewart MRN: [id number] Date of Birth: 31-May-1936  [facility] CM/SW Contact  Doneta Glenys DASEN, RN Phone Number: 09/15/2024, 4:35 PM  Clinical Narrative:    CM spoke with CHRISHAUN, SASSO (Spouse), [phone]. [facility]intosh [facility] CM called [phone]), referral received by Sherrell.   Expected Discharge Plan: Skilled Nursing Facility Barriers to Discharge: Continued Medical Work up               Expected Discharge Plan and Services In-house Referral: NA Discharge Planning Services: CM Consult Post Acute Care Choice: Skilled Nursing Facility Living arrangements for the past 2 months: Single Family Home                 DME Arranged: N/A DME Agency: NA       HH Arranged: NA HH Agency: NA         Social Drivers of Health (SDOH) Interventions SDOH Screenings   Food Insecurity: No Food Insecurity (09/08/2024)  Housing: Unknown (09/08/2024)  Transportation Needs: No Transportation Needs (09/08/2024)  Utilities: Not At Risk (09/08/2024)  Depression (PHQ2-9): High Risk (04/05/2024)  Social Connections: Unknown (09/08/2024)  Tobacco Use: Low Risk (09/08/2024)    Readmission Risk Interventions    09/09/2024    4:56 PM  Readmission Risk Prevention Plan  Transportation Screening Complete  PCP or Specialist Appt within 5-7 Days Complete  Home Care Screening Complete  Medication Review (RN CM) Complete

## 2024-09-16 ENCOUNTER — Other Ambulatory Visit (HOSPITAL_COMMUNITY): Payer: Self-pay

## 2024-09-16 DIAGNOSIS — A419 Sepsis, unspecified organism: Secondary | ICD-10-CM | POA: Diagnosis not present

## 2024-09-16 DIAGNOSIS — J189 Pneumonia, unspecified organism: Secondary | ICD-10-CM | POA: Diagnosis not present

## 2024-09-16 LAB — GLUCOSE, CAPILLARY
Glucose-Capillary: 187 mg/dL — ABNORMAL HIGH (ref 70–99)
Glucose-Capillary: 164 mg/dL — ABNORMAL HIGH (ref 70–99)

## 2024-09-16 MED ORDER — MORPHINE SULFATE (CONCENTRATE) 10 MG /0.5 ML PO SOLN
5.0000 mg | ORAL | 0 refills | Status: AC | PRN
  Filled 2024-09-16: qty 30, 15d supply, fill #0

## 2024-09-16 MED ORDER — ZOLPIDEM TARTRATE 5 MG PO TABS
5.0000 mg | ORAL_TABLET | Freq: Every day | ORAL | 0 refills | Status: AC
  Filled 2024-09-16: qty 10, 10d supply, fill #0

## 2024-09-16 MED ORDER — ACETAMINOPHEN 500 MG PO TABS: 1000.0000 mg | ORAL_TABLET | Freq: Three times a day (TID) | ORAL | Status: AC

## 2024-09-16 MED ORDER — SENNA 8.6 MG PO TABS
2.0000 | ORAL_TABLET | Freq: Every day | ORAL | Status: DC
  Administered 2024-09-16: 08:00:00 8.6 mg via ORAL
  Filled 2024-09-16: qty 2

## 2024-09-16 MED ORDER — TAMSULOSIN HCL 0.4 MG PO CAPS
0.4000 mg | ORAL_CAPSULE | Freq: Every day | ORAL | 0 refills | Status: AC
  Filled 2024-09-16: qty 30, 30d supply, fill #0

## 2024-09-16 MED ORDER — SENNA 8.6 MG PO TABS
2.0000 | ORAL_TABLET | Freq: Every day | ORAL | 0 refills | Status: AC
  Filled 2024-09-16: qty 60, 30d supply, fill #0

## 2024-09-16 MED ORDER — POLYETHYLENE GLYCOL 3350 17 GM/SCOOP PO POWD
17.0000 g | Freq: Every day | ORAL | 0 refills | Status: AC
  Filled 2024-09-16: qty 238, 14d supply, fill #0

## 2024-09-16 MED ORDER — ONDANSETRON HCL 4 MG PO TABS
4.0000 mg | ORAL_TABLET | Freq: Four times a day (QID) | ORAL | 0 refills | Status: AC | PRN
  Filled 2024-09-16: qty 20, 5d supply, fill #0

## 2024-09-16 MED ADMIN — Lorazepam Inj 2 MG/ML: 0.5 mg | INTRAVENOUS | @ 17:00:00 | NDC 65219036801

## 2024-09-16 MED FILL — Lorazepam Inj 2 MG/ML: 0.5000 mg | INTRAMUSCULAR | Qty: 1 | Status: AC

## 2024-09-16 NOTE — Progress Notes (Signed)
 Discharge Medications delivered from TOC meds to bed [facility] outpatient pharmacy by this RN.

## 2024-09-16 NOTE — Progress Notes (Signed)
 Palliative:  HPI: 88 y.o. 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 Required intubation 8/12.   I met today with 3 daughters, sister, brother, and Inge Lecher NP PCCM. We had discussion regarding path forward for one way extubation. We reviewed poor prognosis and expectation that Jeffery Stewart will not do well once extubated. Family reiterate goal that he not suffer. We reviewed option to have medication available if he is having distress vs having medication already onboard to prevent distress. Family would like to have medication onboard to minimize any suffering during transition off ventilator. They would like to move forward with extubation later today and will have the rest of the family come to visit prior to extubation.   I reviewed plans and symptom management recommendations with RN.   Update: I was present with family prior to additional support as well as throughout extubation. Family appropriately tearful. Assisted to ensure comfort. I left family to visit privately with Jeffery Stewart after he is resting comfortably after extubation.   All questions/concerns addressed. Emotional support provided. Much time coordinating care with RN, PCCM, and family.   Exam: Sedated on vent. FiO2 50%. Breathing regular, unlabored on vent. No distress. Not following commands. Abd soft. Warm to touch.   Plan:  - DNR - Extubated to full comfort care - Anticipate hospital death  100 min  Jeffery Kitty, NP Palliative Medicine Team Pager [phone] (Please see [email or web address] for schedule) Team Phone [phone]

## 2024-09-16 NOTE — TOC Progression Note (Addendum)
 Transition of Care [facility]) - Progression Note    Patient Details  Name: Jeffery Stewart MRN: [id number] Date of Birth: 1936-01-30  Transition [facility]) CM/SW Contact  Sonda Manuella Quill, RN Phone Number: 09/16/2024, 11:07 AM  Clinical Narrative:    Referral for home hospice previously given to [facility]; notified Cheri Nyle, [facility] Liaison for agency; she said she is enroute to hospital to speak w/ family; she will update this RN CM.   -[phone]- Return call from [facility]; she said pt will d/c w/ agency; [facility] for transport; wife has requested hosp bed, over bed table, wheelchair, and BSC which are to be delivered prior to d/c; she has requested it be delivered STAT and will take up to 4 hrs; she will notify IP CM when transport can be called.   Expected Discharge Plan: Skilled Nursing Facility Barriers to Discharge: Continued Medical Work up               Expected Discharge Plan and Services In-house Referral: NA Discharge Planning Services: CM Consult Post Acute Care Choice: Skilled Nursing Facility Living arrangements for the past 2 months: Single Family Home Expected Discharge Date: 09/16/24               DME Arranged: N/A DME Agency: NA       HH Arranged: NA HH Agency: NA         Social Drivers of Health (SDOH) Interventions SDOH Screenings   Food Insecurity: No Food Insecurity (09/08/2024)  Housing: Unknown (09/08/2024)  Transportation Needs: No Transportation Needs (09/08/2024)  Utilities: Not At Risk (09/08/2024)  Depression (PHQ2-9): High Risk (04/05/2024)  Social Connections: Unknown (09/08/2024)  Tobacco Use: Low Risk (09/08/2024)    Readmission Risk Interventions    09/09/2024    4:56 PM  Readmission Risk Prevention Plan  Transportation Screening Complete  PCP or Specialist Appt within 5-7 Days Complete  Home Care Screening Complete  Medication Review (RN CM) Complete

## 2024-09-16 NOTE — Discharge Summary (Signed)
 Physician Discharge Summary  Jeffery Stewart FMW:[id number] DOB: April 21, 1936 DOA: 09/08/2024  PCP: Nedra Tinnie LABOR, NP  Admit date: 09/08/2024 Discharge date: 09/16/2024  Admitted From: Home Disposition: Home with hospice  Recommendations for Outpatient Follow-up:  Follow up with home hospice at earliest convenience Continue Foley catheter on discharge   Home Health: Home hospice Equipment/Devices: Foley catheter  Discharge Condition: Poor  CODE STATUS: DNR Diet recommendation: As per comfort measures/hospice  Brief/Interim Summary: 88 y.o. male with medical history significant of advanced dementia, type 2 diabetes, neuropathy, postpolio syndrome, recent fall at home resulting in left humerus fracture for which he was placed in a sling and sent home from the ED with subsequent evaluation by orthopedics in the office recommended conservative management.  He presented with increasing lethargy and low-grade fever and was admitted for pneumonia and started on IV antibiotics.  PT recommended SNF placement.  Palliative care consulted as well.  After palliative care discussions with family, family has decided to pursue home hospice.  He will be discharged to home hospice once arrangements have been made.  Discharge Diagnoses:   Sepsis: Present on admission: Resolved Possible aspiration pneumonia: Present on admission - Initially treated with Rocephin  and Zithromax  but has already been switched to Augmentin  and Zithromax .  Patient has received 7-day course of antibiotics.  DC antibiotics. -Diet as per SLP recommendations. - Currently hemodynamically stable with no temperature spikes.  Blood cultures negative so far.  Sepsis has resolved.  On room air. - On presentation, CT of the abdomen and pelvis did not show any acute findings except for constipation   History of advanced dementia with behavioral disturbance/acute metabolic encephalopathy Goals of care - Mental status currently stable and  back to baseline.  Monitor mental status.  Fall precautions/delirium precautions.  CT of the head did not show any acute findings on presentation - Continue duloxetine  - Palliative care following.After palliative care discussions with family, family has decided to pursue home hospice.  He will be discharged to home hospice once arrangements have been made.   Acute urinary retention - Patient failed voiding trial on 09/13/2024 and Foley catheter had to be placed again.  Continue Flomax .  Continue Foley catheter on discharge.   Diabetes mellitus type 2 with hyperglycemia -continue home regimen  Leukocytosis - Resolved   CKD stage IIIb - Creatinine currently at baseline.    Anemia of chronic disease - From chronic illnesses.  Hemoglobin stable.   Hyponatremia - Mild.  No labs today.  Would not repeat any labs since he is going home with hospice   Left humerus fracture - Secondary to recent fall at home.  Seen by orthopedics recently: Recommended conservative management.  Continue with sling.  Fall precautions.   Physical deconditioning - PT recommending SNF placement.  Discussion as above Discharge Instructions  Discharge Instructions     Discharge wound care:   Complete by: As directed    As per wound care consult recommendations   Increase activity slowly   Complete by: As directed       Allergies as of 09/16/2024       Reactions   Garlic Nausea Only, Other (See Comments)   Face and body get hot- for a few days afterwards. The patient gets sick. He CANNOT have garlic- in ANY form.        Medication List     STOP taking these medications    Accu-Chek FastClix Lancets Misc   Accu-Chek Guide Test test strip Generic drug: glucose blood  b complex vitamins capsule   Belsomra  5 MG Tabs Generic drug: Suvorexant    GlucoCom Blood Glucose Monitor Devi   oxyCODONE -acetaminophen  5-325 MG tablet Commonly known as: Percocet   Vitamin D3 50 MCG (2000 UT) Chew        TAKE these medications    acetaminophen  500 MG tablet Commonly known as: TYLENOL  Take 2 tablets (1,000 mg total) by mouth every 8 (eight) hours. What changed:  how much to take when to take this reasons to take this   B-12 1000 MCG Chew Chew 2,000 mcg by mouth daily.   clonazePAM  1 MG tablet Commonly known as: KLONOPIN  Take 1-2 tablets (1-2 mg total) by mouth at bedtime. What changed: how much to take   cycloSPORINE  0.05 % ophthalmic emulsion Commonly known as: RESTASIS  Place 1 drop into both eyes 2 (two) times daily.   DULoxetine  60 MG capsule Commonly known as: CYMBALTA  Take 60 mg by mouth daily.   famotidine  20 MG tablet Commonly known as: PEPCID  Take 20 mg by mouth 2 (two) times daily.   gabapentin  300 MG capsule Commonly known as: NEURONTIN  Take 300-600 mg by mouth See admin instructions. Take 600 mg by mouth in the morning, 300 mg at lunchtime, 600 mg at suppertime, and 600 mg at bedtime   hydrOXYzine  25 MG tablet Commonly known as: ATARAX  Take 25 mg by mouth daily as needed for anxiety (and an additional 25 mg later- if needed for unresolved anxiousness).   lidocaine  5 % Commonly known as: LIDODERM  Place 1 patch onto the skin daily as needed (for pain- Remove & Discard patch within 12 hours or as directed by MD).   metFORMIN  1000 MG tablet Commonly known as: GLUCOPHAGE  Take 1 tablet (1,000 mg total) by mouth 2 (two) times daily with a meal.   morphine  CONCENTRATE 10 mg / 0.5 ml concentrated solution Place 0.25 mLs (5 mg total) under the tongue every 3 (three) hours as needed for severe pain (pain score 7-10).   nitroGLYCERIN  0.4 MG SL tablet Commonly known as: NITROSTAT  Place 1 tablet (0.4 mg total) under the tongue every 5 (five) minutes x 3 doses as needed for chest pain.   ondansetron  4 MG tablet Commonly known as: ZOFRAN  Take 1 tablet (4 mg total) by mouth every 6 (six) hours as needed for nausea.   polyethylene glycol 17 g packet Commonly  known as: MIRALAX  / GLYCOLAX  Take 17 g by mouth daily. Start taking on: September 17, 2024   senna 8.6 MG Tabs tablet Commonly known as: SENOKOT Take 2 tablets (17.2 mg total) by mouth daily. Start taking on: September 17, 2024   sitaGLIPtin  100 MG tablet Commonly known as: Januvia  Take 1 tablet (100 mg total) by mouth daily. What changed: when to take this   tamsulosin  0.4 MG Caps capsule Commonly known as: FLOMAX  Take 1 capsule (0.4 mg total) by mouth daily.   zolpidem  5 MG tablet Commonly known as: AMBIEN  Take 1 tablet (5 mg total) by mouth at bedtime.               Durable Medical Equipment  (From admission, onward)           Start     Ordered   09/10/24 1155  For home use only DME 3 n 1  Once        09/10/24 1154   09/10/24 0933  For home use only DME Hospital bed  Once       Question Answer Comment  Length of Need Lifetime   The above medical condition requires: Patient requires the ability to reposition frequently   Head must be elevated greater than: 30 degrees   Bed type Semi-electric   Support Surface: Gel Overlay      09/10/24 0932              Discharge Care Instructions  (From admission, onward)           Start     Ordered   09/16/24 0000  Discharge wound care:       Comments: As per wound care consult recommendations   09/16/24 1010            Contact information for after-discharge care     Destination     [facility] .   Service: Skilled Nursing Contact information: [address] [place]  [phone]             Home Medical Care     [facility]) .   Service: Home Health Services Contact information: [address] [place]  [address] [phone]                    Allergies[1]  Consultations: Palliative care   Procedures/Studies: CT ABDOMEN PELVIS W CONTRAST Result Date: 09/08/2024 CLINICAL DATA:   Abdominal pain. EXAM: CT ABDOMEN AND PELVIS WITH CONTRAST TECHNIQUE: Multidetector CT imaging of the abdomen and pelvis was performed using the standard protocol following bolus administration of intravenous contrast. RADIATION DOSE REDUCTION: This exam was performed according to the departmental dose-optimization program which includes automated exposure control, adjustment of the mA and/or kV according to patient size and/or use of iterative reconstruction technique. CONTRAST:  80mL OMNIPAQUE  IOHEXOL  300 MG/ML  SOLN COMPARISON:  None Available. FINDINGS: Lower chest: Bibasilar linear atelectasis/scarring. No intra-abdominal free air or free fluid. Hepatobiliary: The liver is unremarkable. No biliary dilatation. The gallbladder is unremarkable with Pancreas: Unremarkable. No pancreatic ductal dilatation or surrounding inflammatory changes. Spleen: Normal in size without focal abnormality. Adrenals/Urinary Tract: The adrenal glands unremarkable. The kidneys, visualized ureters, and urinary bladder appear unremarkable. Stomach/Bowel: There is moderate stool throughout the colon. There is no bowel obstruction or active inflammation. The appendix is normal. Vascular/Lymphatic: Mild aortoiliac atherosclerotic disease. The IVC is unremarkable. No portal venous gas. There is no adenopathy. Reproductive: Prostate brachytherapy seeds. Other: None Musculoskeletal: Osteopenia with degenerative changes. No acute osseous pathology. IMPRESSION: 1. No acute intra-abdominal or pelvic pathology. 2. Moderate colonic stool burden. No bowel obstruction. Normal appendix. 3.  Aortic Atherosclerosis (ICD10-I70.0). Electronically Signed   By: Vanetta Chou M.D.   On: 09/08/2024 19:26   CT Head Wo Contrast Result Date: 09/08/2024 EXAM: CT HEAD WITHOUT 09/08/2024 07:15:29 PM TECHNIQUE: CT of the head was performed without the administration of intravenous contrast. Automated exposure control, iterative reconstruction, and/or weight  based adjustment of the mA/kV was utilized to reduce the radiation dose to as low as reasonably achievable. COMPARISON: None available. CLINICAL HISTORY: Head trauma, minor (Age >= 65y); Mental status change, unknown cause FINDINGS: BRAIN AND VENTRICLES: No acute intracranial hemorrhage. No mass effect or midline shift. No extra-axial fluid collection. No evidence of acute infarct. Patchy and confluent decreased attenuation throughout the deep and periventricular white matter of the cerebral hemispheres bilaterally, compatible with chronic microvascular ischemic disease. Cerebral ventricle sizes concordant with degree of cerebral volume loss. Atherosclerotic calcifications within the cavernous Internal Carotid and Vertebral Arteries. No hydrocephalus. ORBITS: Bilateral  lens replacement. SINUSES AND MASTOIDS: No acute abnormality. SOFT TISSUES AND SKULL: No acute skull fracture. No acute soft tissue abnormality. IMPRESSION: 1. No acute intracranial abnormality. Electronically signed by: Morgane Naveau MD 09/08/2024 07:23 PM EST RP Workstation: [id number]   DG Chest Port 1 View Result Date: 09/08/2024 EXAM: 1 VIEW(S) XRAY OF THE CHEST 09/08/2024 05:52:00 PM COMPARISON: 02/10/2023 CLINICAL HISTORY: Questionable sepsis - evaluate for abnormality FINDINGS: LUNGS AND PLEURA: Minimal right mid lung opacity is noted concerning for subsegmental atelectasis or possibly infiltrate. No pleural effusion. No pneumothorax. HEART AND MEDIASTINUM: No acute abnormality of the cardiac and mediastinal silhouettes. BONES AND SOFT TISSUES: No acute osseous abnormality. IMPRESSION: 1. Minimal right mid lung opacity, likely subsegmental atelectasis versus infiltrate. Electronically signed by: Lynwood Seip MD 09/08/2024 06:00 PM EST RP Workstation: [id number]   DG Shoulder Left Result Date: 09/06/2024 CLINICAL DATA:  Fall onto LEFT shoulder. EXAM: LEFT SHOULDER - 2+ VIEW; LEFT HUMERUS - 2+ VIEW COMPARISON:  None available FINDINGS:  LEFT shoulder: Mildly impacted and moderately displaced fracture of the LEFT proximal humerus. No additional fracture or dislocation of the LEFT mid or distal humerus. IMPRESSION: Mildly impacted and moderately displaced fracture of the LEFT proximal humerus. Electronically Signed   By: Aliene Lloyd M.D.   On: 09/06/2024 08:17   DG Humerus Left Result Date: 09/06/2024 CLINICAL DATA:  Fall onto LEFT shoulder. EXAM: LEFT SHOULDER - 2+ VIEW; LEFT HUMERUS - 2+ VIEW COMPARISON:  None available FINDINGS: LEFT shoulder: Mildly impacted and moderately displaced fracture of the LEFT proximal humerus. No additional fracture or dislocation of the LEFT mid or distal humerus. IMPRESSION: Mildly impacted and moderately displaced fracture of the LEFT proximal humerus. Electronically Signed   By: Aliene Lloyd M.D.   On: 09/06/2024 08:17      Subjective: Patient seen and examined at bedside.  No acute distress.  No agitation reported.  Discharge Exam: Vitals:   09/15/24 2056 09/16/24 0537  BP: 133/69 (!) 142/68  Pulse: 74 71  Resp: 17 18  Temp: 97.9 F (36.6 C) 98.2 F (36.8 C)  SpO2: 95% 98%    General: Elderly male lying in bed.  No distress.  On room air.  Slow to respond, confused, poor historian  cardiovascular: rate controlled, S1/S2 + Respiratory: bilateral decreased breath sounds at bases Abdominal: Soft, NT, ND, bowel sounds + Extremities: Trace lower extremity edema; no cyanosis    The results of significant diagnostics from this hospitalization (including imaging, microbiology, ancillary and laboratory) are listed below for reference.     Microbiology: Recent Results (from the past 240 hours)  Culture, blood (Routine x 2)     Status: None   Collection Time: 09/08/24  4:29 PM   Specimen: BLOOD  Result Value Ref Range Status   Specimen Description   Final    BLOOD RIGHT ANTECUBITAL Performed at [facility], [address], [place], [place] [address]    Special  Requests   Final    BOTTLES DRAWN AEROBIC AND ANAEROBIC Blood Culture results may not be optimal due to an inadequate volume of blood received in culture bottles Performed at [facility], [address] [place], [place] [address]    Culture   Final    NO GROWTH 5 DAYS Performed at [facility], [address], [place], [place] [address]    Report Status 09/13/2024 FINAL  Final  Resp panel by RT-PCR (RSV, Flu A&B, Covid) Anterior Nasal Swab  Status: None   Collection Time: 09/08/24  6:26 PM   Specimen: Anterior Nasal Swab  Result Value Ref Range Status   SARS Coronavirus 2 by RT PCR NEGATIVE NEGATIVE Final    Comment: (NOTE) SARS-CoV-2 target nucleic acids are NOT DETECTED.  The SARS-CoV-2 RNA is generally detectable in upper respiratory specimens during the acute phase of infection. The lowest concentration of SARS-CoV-2 viral copies this assay can detect is 138 copies/mL. A negative result does not preclude SARS-Cov-2 infection and should not be used as the sole basis for treatment or other patient management decisions. A negative result may occur with  improper specimen collection/handling, submission of specimen other than nasopharyngeal swab, presence of viral mutation(s) within the areas targeted by this assay, and inadequate number of viral copies(<138 copies/mL). A negative result must be combined with clinical observations, patient history, and epidemiological information. The expected result is Negative.  Fact Sheet for Patients:  bloggercourse.com  Fact Sheet for Healthcare Providers:  seriousbroker.it  This test is no t yet approved or cleared by the [place]  FDA and  has been authorized for detection and/or diagnosis of SARS-CoV-2 by FDA under an Emergency Use Authorization (EUA). This EUA will remain  in effect (meaning this test can be used) for the duration of the COVID-19  declaration under Section 564(b)(1) of the Act, 21 U.S.C.section 360bbb-3(b)(1), unless the authorization is terminated  or revoked sooner.       Influenza A by PCR NEGATIVE NEGATIVE Final   Influenza B by PCR NEGATIVE NEGATIVE Final    Comment: (NOTE) The Xpert Xpress SARS-CoV-2/FLU/RSV plus assay is intended as an aid in the diagnosis of influenza from Nasopharyngeal swab specimens and should not be used as a sole basis for treatment. Nasal washings and aspirates are unacceptable for Xpert Xpress SARS-CoV-2/FLU/RSV testing.  Fact Sheet for Patients: bloggercourse.com  Fact Sheet for Healthcare Providers: seriousbroker.it  This test is not yet approved or cleared by the [place]  FDA and has been authorized for detection and/or diagnosis of SARS-CoV-2 by FDA under an Emergency Use Authorization (EUA). This EUA will remain in effect (meaning this test can be used) for the duration of the COVID-19 declaration under Section 564(b)(1) of the Act, 21 U.S.C. section 360bbb-3(b)(1), unless the authorization is terminated or revoked.     Resp Syncytial Virus by PCR NEGATIVE NEGATIVE Final    Comment: (NOTE) Fact Sheet for Patients: bloggercourse.com  Fact Sheet for Healthcare Providers: seriousbroker.it  This test is not yet approved or cleared by the [place]  FDA and has been authorized for detection and/or diagnosis of SARS-CoV-2 by FDA under an Emergency Use Authorization (EUA). This EUA will remain in effect (meaning this test can be used) for the duration of the COVID-19 declaration under Section 564(b)(1) of the Act, 21 U.S.C. section 360bbb-3(b)(1), unless the authorization is terminated or revoked.  Performed at [facility], [address] [place], [place] [address]   Culture, blood (Routine x 2)     Status: None   Collection Time: 09/09/24  12:48 AM   Specimen: BLOOD RIGHT ARM  Result Value Ref Range Status   Specimen Description   Final    BLOOD RIGHT ARM Performed at [facility], [address] [place], [place] [address]    Special Requests   Final    BOTTLES DRAWN AEROBIC AND ANAEROBIC Blood Culture results may not be optimal due to an inadequate volume of blood received in culture bottles Performed at [facility]  [facility], [address] [place], [place] [address]    Culture   Final    NO GROWTH 5 DAYS Performed at [facility], [address], [place], [place] [address]    Report Status 09/14/2024 FINAL  Final     Labs: BNP (last 3 results) No results for input(s): BNP in the last 8760 hours. Basic Metabolic Panel: Recent Labs  Lab 09/10/24 0550 09/11/24 0658  NA 137 134*  K 4.7 4.6  CL 102 101  CO2 27 20*  GLUCOSE 191* 213*  BUN 24* 17  CREATININE 1.32* 1.18  CALCIUM  8.7* 8.6*  MG 2.3  --    Liver Function Tests: No results for input(s): AST, ALT, ALKPHOS, BILITOT, PROT, ALBUMIN in the last 168 hours. No results for input(s): LIPASE, AMYLASE in the last 168 hours. No results for input(s): AMMONIA in the last 168 hours. CBC: Recent Labs  Lab 09/10/24 0917 09/11/24 0658  WBC 9.4 7.6  HGB 10.9* 10.5*  HCT 33.1* 31.8*  MCV 92.5 92.7  PLT 310 341   Cardiac Enzymes: No results for input(s): CKTOTAL, CKMB, CKMBINDEX, TROPONINI in the last 168 hours. BNP: Invalid input(s): POCBNP CBG: Recent Labs  Lab 09/15/24 1204 09/15/24 1644 09/15/24 1752 09/15/24 2058 09/16/24 0727  GLUCAP 225* 69* 128* 162* 187*   D-Dimer No results for input(s): DDIMER in the last 72 hours. Hgb A1c No results for input(s): HGBA1C in the last 72 hours. Lipid Profile No results for input(s): CHOL, HDL, LDLCALC, TRIG, CHOLHDL, LDLDIRECT in the last 72 hours. Thyroid  function studies No results for input(s): TSH, T4TOTAL, T3FREE,  THYROIDAB in the last 72 hours.  Invalid input(s): FREET3 Anemia work up No results for input(s): VITAMINB12, FOLATE, FERRITIN, TIBC, IRON, RETICCTPCT in the last 72 hours. Urinalysis    Component Value Date/Time   COLORURINE YELLOW 09/08/2024 1729   APPEARANCEUR CLEAR 09/08/2024 1729   LABSPEC 1.014 09/08/2024 1729   PHURINE 5.0 09/08/2024 1729   GLUCOSEU 150 (A) 09/08/2024 1729   HGBUR NEGATIVE 09/08/2024 1729   BILIRUBINUR NEGATIVE 09/08/2024 1729   BILIRUBINUR negative 03/03/2024 1430   KETONESUR NEGATIVE 09/08/2024 1729   PROTEINUR NEGATIVE 09/08/2024 1729   UROBILINOGEN 0.2 03/03/2024 1430   NITRITE NEGATIVE 09/08/2024 1729   LEUKOCYTESUR NEGATIVE 09/08/2024 1729   Sepsis Labs Recent Labs  Lab 09/10/24 0917 09/11/24 0658  WBC 9.4 7.6   Microbiology Recent Results (from the past 240 hours)  Culture, blood (Routine x 2)     Status: None   Collection Time: 09/08/24  4:29 PM   Specimen: BLOOD  Result Value Ref Range Status   Specimen Description   Final    BLOOD RIGHT ANTECUBITAL Performed at [facility], [address] [place], [place] [address]    Special Requests   Final    BOTTLES DRAWN AEROBIC AND ANAEROBIC Blood Culture results may not be optimal due to an inadequate volume of blood received in culture bottles Performed at [facility], [address], [place], [place] [address]    Culture   Final    NO GROWTH 5 DAYS Performed at [facility], [address] [place], [place] [address]    Report Status 09/13/2024 FINAL  Final  Resp panel by RT-PCR (RSV, Flu A&B, Covid) Anterior Nasal Swab     Status: None   Collection Time: 09/08/24  6:26 PM   Specimen: Anterior Nasal Swab  Result Value Ref Range Status   SARS Coronavirus  2 by RT PCR NEGATIVE NEGATIVE Final    Comment: (NOTE) SARS-CoV-2 target nucleic acids are NOT DETECTED.  The SARS-CoV-2 RNA is generally detectable in upper  respiratory specimens during the acute phase of infection. The lowest concentration of SARS-CoV-2 viral copies this assay can detect is 138 copies/mL. A negative result does not preclude SARS-Cov-2 infection and should not be used as the sole basis for treatment or other patient management decisions. A negative result may occur with  improper specimen collection/handling, submission of specimen other than nasopharyngeal swab, presence of viral mutation(s) within the areas targeted by this assay, and inadequate number of viral copies(<138 copies/mL). A negative result must be combined with clinical observations, patient history, and epidemiological information. The expected result is Negative.  Fact Sheet for Patients:  bloggercourse.com  Fact Sheet for Healthcare Providers:  seriousbroker.it  This test is no t yet approved or cleared by the [place]  FDA and  has been authorized for detection and/or diagnosis of SARS-CoV-2 by FDA under an Emergency Use Authorization (EUA). This EUA will remain  in effect (meaning this test can be used) for the duration of the COVID-19 declaration under Section 564(b)(1) of the Act, 21 U.S.C.section 360bbb-3(b)(1), unless the authorization is terminated  or revoked sooner.       Influenza A by PCR NEGATIVE NEGATIVE Final   Influenza B by PCR NEGATIVE NEGATIVE Final    Comment: (NOTE) The Xpert Xpress SARS-CoV-2/FLU/RSV plus assay is intended as an aid in the diagnosis of influenza from Nasopharyngeal swab specimens and should not be used as a sole basis for treatment. Nasal washings and aspirates are unacceptable for Xpert Xpress SARS-CoV-2/FLU/RSV testing.  Fact Sheet for Patients: bloggercourse.com  Fact Sheet for Healthcare Providers: seriousbroker.it  This test is not yet approved or cleared by the [place]  FDA and has been  authorized for detection and/or diagnosis of SARS-CoV-2 by FDA under an Emergency Use Authorization (EUA). This EUA will remain in effect (meaning this test can be used) for the duration of the COVID-19 declaration under Section 564(b)(1) of the Act, 21 U.S.C. section 360bbb-3(b)(1), unless the authorization is terminated or revoked.     Resp Syncytial Virus by PCR NEGATIVE NEGATIVE Final    Comment: (NOTE) Fact Sheet for Patients: bloggercourse.com  Fact Sheet for Healthcare Providers: seriousbroker.it  This test is not yet approved or cleared by the [place]  FDA and has been authorized for detection and/or diagnosis of SARS-CoV-2 by FDA under an Emergency Use Authorization (EUA). This EUA will remain in effect (meaning this test can be used) for the duration of the COVID-19 declaration under Section 564(b)(1) of the Act, 21 U.S.C. section 360bbb-3(b)(1), unless the authorization is terminated or revoked.  Performed at [facility], [address] [place], [place] [address]   Culture, blood (Routine x 2)     Status: None   Collection Time: 09/09/24 12:48 AM   Specimen: BLOOD RIGHT ARM  Result Value Ref Range Status   Specimen Description   Final    BLOOD RIGHT ARM Performed at [facility], [address], [place], [place] [address]    Special Requests   Final    BOTTLES DRAWN AEROBIC AND ANAEROBIC Blood Culture results may not be optimal due to an inadequate volume of blood received in culture bottles Performed at [facility], [address], [place], [place] [address]    Culture   Final    NO GROWTH 5 DAYS Performed at [facility], [address] [place], [place] [address]    Report Status 09/14/2024 FINAL  Final     Time coordinating discharge: 35 minutes  SIGNED:   Sophie Mao, MD  [facility] 09/16/2024, 10:10 AM      [1]   Allergies Allergen Reactions   Garlic Nausea Only and Other (See Comments)    Face and body get hot- for a few days afterwards. The patient gets sick. He CANNOT have garlic- in ANY form.

## 2024-09-16 NOTE — Plan of Care (Signed)
   Problem: Elimination: Goal: Will not experience complications related to bowel motility Outcome: Progressing   Problem: Pain Managment: Goal: General experience of comfort will improve and/or be controlled Outcome: Progressing   Problem: Safety: Goal: Ability to remain free from injury will improve Outcome: Progressing

## 2024-09-16 NOTE — Plan of Care (Signed)

## 2024-09-16 NOTE — TOC Transition Note (Addendum)
 Transition of Care [facility]) - Discharge Note   Patient Details  Name: Jeffery Stewart MRN: [id number] Date of Birth: [birth year]  [facility] CM/SW Contact:  Sonda Manuella Quill, RN Phone Number: 09/16/2024, 2:44 PM   Clinical Narrative:    D/C orders received; spoke w/ pt's spouse Avelina Arms at bedside; she agreed that pt will d/c home w/ [facility], and transport by [facility]; she verified d/c address: [address] [place], [place] [address]; awaiting delivery of dme before [facility] called for transport; bedside RN will call for transportation.  -1700- notified by Mag[facility] DME has been delivered to pt's home; [facility] has been called by [facility], RN; no IP CM needs. Final next level of care: Home w Hospice Care Barriers to Discharge: No Barriers Identified   Patient Goals and CMS Choice Patient states their goals for this hospitalization and ongoing recovery are:: Home per spouse CMS Medicare.gov Compare Post Acute Care list provided to:: Patient Represenative (must comment) Choice offered to / list presented to : Spouse Wilmer ownership interest in [facility].provided to:: Spouse    Discharge Placement                  Name of family member notified: Tung Pustejovsky (spouse) Patient and family notified of of transfer: 09/16/24  Discharge Plan and Services Additional resources added to the After Visit Summary for   In-house Referral: NA Discharge Planning Services: CM Consult Post Acute Care Choice: Skilled Nursing Facility          DME Arranged: Bedside commode, Hospital bed, Overbed table, Wheelchair manual DME Agency: Other - Comment ([facility]) Date DME Agency Contacted: 09/16/24 Time DME Agency Contacted: 1256 Representative spoke with at DME Agency: [facility] HH Arranged: NA HH Agency: NA        Social Drivers of Health (SDOH) Interventions SDOH Screenings   Food Insecurity: No Food Insecurity (09/08/2024)  Housing:  Unknown (09/08/2024)  Transportation Needs: No Transportation Needs (09/08/2024)  Utilities: Not At Risk (09/08/2024)  Depression (PHQ2-9): High Risk (04/05/2024)  Social Connections: Unknown (09/08/2024)  Tobacco Use: Low Risk (09/08/2024)     Readmission Risk Interventions    09/09/2024    4:56 PM  Readmission Risk Prevention Plan  Transportation Screening Complete  PCP or Specialist Appt within 5-7 Days Complete  Home Care Screening Complete  Medication Review (RN CM) Complete

## 2024-09-16 NOTE — Progress Notes (Signed)
 SLP Cancellation Note  Patient Details Name: Jeffery Stewart MRN: [id number] DOB: Jul 20, 1936   Cancelled treatment:       Reason Eval/Treat Not Completed: Other (comment)  Patient is discharging home with hospice. SLP to s/o at this time.   Norleen IVAR Blase, MA, CCC-SLP Speech Therapy  09/16/2024, 4:10 PM

## 2024-09-16 NOTE — Care Management Important Message (Signed)
 Important Message  Patient Details No IM Letter given due to Discharging with Hospice. Name: Jeffery Stewart MRN: [id number] Date of Birth: 1936-07-06   Important Message Given:  No     [name] 09/16/2024, 10:33 AM

## 2024-09-21 ENCOUNTER — Ambulatory Visit: Admitting: Nurse Practitioner

## 2024-09-24 ENCOUNTER — Encounter: Payer: Self-pay | Admitting: Nurse Practitioner

## 2024-09-28 ENCOUNTER — Other Ambulatory Visit (HOSPITAL_COMMUNITY): Payer: Self-pay

## 2024-10-05 ENCOUNTER — Other Ambulatory Visit (HOSPITAL_COMMUNITY): Payer: Self-pay

## 2024-10-31 DEATH — deceased
